# Patient Record
Sex: Female | Born: 1995 | Race: Black or African American | Hispanic: No | Marital: Single | Smoking: Former smoker
Health system: Southern US, Community
[De-identification: ages and names within clinical notes are randomized; demographics above are authoritative.]

## PROBLEM LIST (undated history)

## (undated) DIAGNOSIS — A749 Chlamydial infection, unspecified: Secondary | ICD-10-CM

## (undated) DIAGNOSIS — A549 Gonococcal infection, unspecified: Secondary | ICD-10-CM

## (undated) DIAGNOSIS — Z789 Other specified health status: Secondary | ICD-10-CM

## (undated) HISTORY — DX: Chlamydial infection, unspecified: A74.9

## (undated) HISTORY — PX: NO PAST SURGERIES: SHX2092

## (undated) HISTORY — DX: Gonococcal infection, unspecified: A54.9

---
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
## 2016-01-19 ENCOUNTER — Ambulatory Visit (INDEPENDENT_AMBULATORY_CARE_PROVIDER_SITE_OTHER): Payer: Managed Care, Other (non HMO) | Admitting: Family Medicine

## 2016-01-19 VITALS — BP 108/84 | HR 80 | Temp 98.2°F | Resp 16 | Ht 65.0 in | Wt 190.0 lb

## 2016-01-19 DIAGNOSIS — B349 Viral infection, unspecified: Secondary | ICD-10-CM

## 2016-01-19 DIAGNOSIS — J988 Other specified respiratory disorders: Secondary | ICD-10-CM

## 2016-01-19 DIAGNOSIS — R509 Fever, unspecified: Secondary | ICD-10-CM | POA: Diagnosis not present

## 2016-01-19 DIAGNOSIS — B9789 Other viral agents as the cause of diseases classified elsewhere: Secondary | ICD-10-CM

## 2016-01-19 LAB — POCT RAPID STREP A (OFFICE): Rapid Strep A Screen: NEGATIVE

## 2016-01-19 LAB — POCT INFLUENZA A/B
INFLUENZA A, POC: NEGATIVE
INFLUENZA B, POC: NEGATIVE

## 2016-01-19 NOTE — Patient Instructions (Addendum)
Great to meet you!  Use tylenol and motrin as needed, Mucinex DM 12 hour is a very good cough medicine    Viral Infections A viral infection can be caused by different types of viruses.Most viral infections are not serious and resolve on their own. However, some infections may cause severe symptoms and may lead to further complications. SYMPTOMS Viruses can frequently cause:  Minor sore throat.  Aches and pains.  Headaches.  Runny nose.  Different types of rashes.  Watery eyes.  Tiredness.  Cough.  Loss of appetite.  Gastrointestinal infections, resulting in nausea, vomiting, and diarrhea. These symptoms do not respond to antibiotics because the infection is not caused by bacteria. However, you might catch a bacterial infection following the viral infection. This is sometimes called a "superinfection." Symptoms of such a bacterial infection may include:  Worsening sore throat with pus and difficulty swallowing.  Swollen neck glands.  Chills and a high or persistent fever.  Severe headache.  Tenderness over the sinuses.  Persistent overall ill feeling (malaise), muscle aches, and tiredness (fatigue).  Persistent cough.  Yellow, green, or brown mucus production with coughing. HOME CARE INSTRUCTIONS   Only take over-the-counter or prescription medicines for pain, discomfort, diarrhea, or fever as directed by your caregiver.  Drink enough water and fluids to keep your urine clear or pale yellow. Sports drinks can provide valuable electrolytes, sugars, and hydration.  Get plenty of rest and maintain proper nutrition. Soups and broths with crackers or rice are fine. SEEK IMMEDIATE MEDICAL CARE IF:   You have severe headaches, shortness of breath, chest pain, neck pain, or an unusual rash.  You have uncontrolled vomiting, diarrhea, or you are unable to keep down fluids.  You or your child has an oral temperature above 102 F (38.9 C), not controlled by  medicine.  Your baby is older than 3 months with a rectal temperature of 102 F (38.9 C) or higher.  Your baby is 773 months old or younger with a rectal temperature of 100.4 F (38 C) or higher. MAKE SURE YOU:   Understand these instructions.  Will watch your condition.  Will get help right away if you are not doing well or get worse.   This information is not intended to replace advice given to you by your health care provider. Make sure you discuss any questions you have with your health care provider.   Document Released: 07/26/2005 Document Revised: 01/08/2012 Document Reviewed: 03/24/2015 Elsevier Interactive Patient Education [employer]2016 Elsevier Inc.

## 2016-01-19 NOTE — Progress Notes (Signed)
   HPI  Patient presents today with fever and sore throat.  Patient explains over the last 2 days she's had sore throat, one day ago she developed fever of 101.4. She's also got body aches, nasal congestion, and mild dry cough. She denies any shortness of breath, chest pain, or food or fluid intolerance.  She has no sick contacts that she knows of.   PMH: Smoking status noted ROS: Per HPI  Objective: BP 108/84 mmHg  Pulse 80  Temp(Src) 98.2 F (36.8 C) (Oral)  Resp 16  Ht 5\' 5"  (1.651 m)  Wt 190 lb (86.183 kg)  BMI 31.62 kg/m2  LMP 12/29/2015 Gen: NAD, alert, cooperative with exam HEENT: NCAT, nares with swollen turbinates bilaterally, TMs normal bilaterally, oropharynx with slightly swollen tonsils that are not erythematous Neck: No tender lymphadenopathy CV: RRR, good S1/S2, no murmur Resp: CTABL, no wheezes, non-labored Ext: No edema, warm Neuro: Alert and oriented, No gross deficits  Rapid flu and strep neg  Assessment and plan:  # Viral resp illness Discussed supportive care, usual course of illness RTC with any concerns or worsening symps   Murtis SinkSam Bradshaw, MD [facility] 01/19/2016, 10:23 AM

## 2016-05-01 ENCOUNTER — Telehealth: Payer: Self-pay | Admitting: Nurse Practitioner

## 2016-05-01 ENCOUNTER — Ambulatory Visit (INDEPENDENT_AMBULATORY_CARE_PROVIDER_SITE_OTHER): Payer: Managed Care, Other (non HMO) | Admitting: Nurse Practitioner

## 2016-05-01 ENCOUNTER — Encounter: Payer: Self-pay | Admitting: Nurse Practitioner

## 2016-05-01 VITALS — BP 100/60 | HR 74 | Temp 98.3°F | Resp 18 | Ht 65.0 in | Wt 195.0 lb

## 2016-05-01 DIAGNOSIS — N912 Amenorrhea, unspecified: Secondary | ICD-10-CM

## 2016-05-01 LAB — POCT URINE PREGNANCY: Preg Test, Ur: POSITIVE — AB

## 2016-05-01 NOTE — Progress Notes (Signed)
20 y.o. G1P0 Single  African American [facility] here for consultation and confirmation of pregnancy with LMP 02/28/2016.  She is now at 9 wk's and 1 day.  She denies any vaginal bleeding or pain.  She is not on prenatal MVI but plans to start.  Same partner for 9 month.  Had been on Mirena IUD for about a year.  She had this removed 12/2015 after frequent vaginitis symptoms.  [facility].  Since then condoms only - but non compliant.  She has a list of OB/ GYN and will make her own apt.  Mother is with her today. 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  Patient's last menstrual period was 02/28/2016.          Sexually active: Yes.    The current method of family planning is none.    Exercising: No.  The patient does not participate in regular exercise at present. Smoker:  no  Health Maintenance: Pap:  never TDaP:  05/11/2015  Gardasil: Unsure HIV: 09/16/15 Neg Labs: STD checked 09/16/2015   reports that she has never smoked. She has never used smokeless tobacco. She reports that she does not drink alcohol or use illicit drugs.  Past Medical History  Diagnosis Date  . Allergy   . Gonorrhea 08/2015  . Chlamydia infection 08/2015    History reviewed. No pertinent past surgical history.  No current outpatient prescriptions on file.   No current facility-administered medications for this visit.    Family History  Problem Relation Age of Onset  . Allergies Neg Hx   . Cancer Neg Hx   . Hyperlipidemia Neg Hx   . Heart disease Neg Hx   . Mental illness Neg Hx   . Mental retardation Neg Hx   . Obesity Neg Hx   . Seizures Neg Hx   . Epilepsy Neg Hx   . Diabetes Maternal Grandmother   . Diabetes Maternal Grandfather     ROS:  Pertinent items are noted in HPI.  Otherwise, a comprehensive ROS was negative.  Exam:   BP 100/60 mmHg  Pulse 74  Temp(Src) 98.3 F (36.8 C) (Oral)  Resp 18  Ht 5\' 5"  (1.651 m)  Wt 195 lb (88.451 kg)  BMI 32.45 kg/m2   LMP 02/28/2016 Height: 5\' 5"  (165.1 cm) Ht Readings from Last 3 Encounters:  05/01/16 5\' 5"  (1.651 m) (61 %*, Z = 0.28)  01/19/16 5\' 5"  (1.651 m) (61 %*, Z = 0.29)  09/16/15 5\' 5"  (1.651 m) (61 %*, Z = 0.29)   * Growth percentiles are based on CDC 2-20 Years data.    General appearance: alert, cooperative and appears stated age  Pelvic: not done at this time - will be done with OB/GYN  Chaperone present: mother with pt during interview  A:  Amenorrhea with + UPT  Prior use of Mirena IUD - removed 12/2015 after 1 yr secondary to infections  History of GC and Chl 08/2015  P:   Reviewed health and wellness pertinent to exam  She will make her own apt.  If she needs help to call back  She is to report any vaginal bleeding and / or pain  She will start on Prenatal MVI  Counseled on adequate intake of calcium and vitamin D, diet and exercise return annually or prn  An After Visit Summary was printed and given to the patient.

## 2016-05-01 NOTE — Telephone Encounter (Signed)
Patient was seen in office for pregnancy confirmation and will [facility] Attention Ms. Hansen and fax number is [phone].

## 2016-05-01 NOTE — Telephone Encounter (Signed)
Routing to Ria CommentPatricia Grubb, FNP and encounter is closed.

## 2016-05-01 NOTE — Patient Instructions (Addendum)
EDD 12/04/16  First Trimester of Pregnancy The first trimester of pregnancy is from week 1 until the end of week 12 (months 1 through 3). A week after a sperm fertilizes an egg, the egg will implant on the wall of the uterus. This embryo will begin to develop into a baby. Genes from you and your partner are forming the baby. The female genes determine whether the baby is a boy or a girl. At 6-8 weeks, the eyes and face are formed, and the heartbeat can be seen on ultrasound. At the end of 12 weeks, all the baby's organs are formed.  Now that you are pregnant, you will want to do everything you can to have a healthy baby. Two of the most important things are to get good prenatal care and to follow your health care provider's instructions. Prenatal care is all the medical care you receive before the baby's birth. This care will help prevent, find, and treat any problems during the pregnancy and childbirth. BODY CHANGES Your body goes through many changes during pregnancy. The changes vary from woman to woman.   You may gain or lose a couple of pounds at first.  You may feel sick to your stomach (nauseous) and throw up (vomit). If the vomiting is uncontrollable, call your health care provider.  You may tire easily.  You may develop headaches that can be relieved by medicines approved by your health care provider.  You may urinate more often. Painful urination may mean you have a bladder infection.  You may develop heartburn as a result of your pregnancy.  You may develop constipation because certain hormones are causing the muscles that push waste through your intestines to slow down.  You may develop hemorrhoids or swollen, bulging veins (varicose veins).  Your breasts may begin to grow larger and become tender. Your nipples may stick out more, and the tissue that surrounds them (areola) may become darker.  Your gums may bleed and may be sensitive to brushing and flossing.  Dark spots or  blotches (chloasma, mask of pregnancy) may develop on your face. This will likely fade after the baby is born.  Your menstrual periods will stop.  You may have a loss of appetite.  You may develop cravings for certain kinds of food.  You may have changes in your emotions from day to day, such as being excited to be pregnant or being concerned that something may go wrong with the pregnancy and baby.  You may have more vivid and strange dreams.  You may have changes in your hair. These can include thickening of your hair, rapid growth, and changes in texture. Some women also have hair loss during or after pregnancy, or hair that feels dry or thin. Your hair will most likely return to normal after your baby is born. WHAT TO EXPECT AT YOUR PRENATAL VISITS During a routine prenatal visit:  You will be weighed to make sure you and the baby are growing normally.  Your blood pressure will be taken.  Your abdomen will be measured to track your baby's growth.  The fetal heartbeat will be listened to starting around week 10 or 12 of your pregnancy.  Test results from any previous visits will be discussed. Your health care provider may ask you:  How you are feeling.  If you are feeling the baby move.  If you have had any abnormal symptoms, such as leaking fluid, bleeding, severe headaches, or abdominal cramping.  If you are using  any tobacco products, including cigarettes, chewing tobacco, and electronic cigarettes.  If you have any questions. Other tests that may be performed during your first trimester include:  Blood tests to find your blood type and to check for the presence of any previous infections. They will also be used to check for low iron levels (anemia) and Rh antibodies. Later in the pregnancy, blood tests for diabetes will be done along with other tests if problems develop.  Urine tests to check for infections, diabetes, or protein in the urine.  An ultrasound to confirm  the proper growth and development of the baby.  An amniocentesis to check for possible genetic problems.  Fetal screens for spina bifida and Down syndrome.  You may need other tests to make sure you and the baby are doing well.  HIV (human immunodeficiency virus) testing. Routine prenatal testing includes screening for HIV, unless you choose not to have this test. HOME CARE INSTRUCTIONS  Medicines  Follow your health care provider's instructions regarding medicine use. Specific medicines may be either safe or unsafe to take during pregnancy.  Take your prenatal vitamins as directed.  If you develop constipation, try taking a stool softener if your health care provider approves. Diet  Eat regular, well-balanced meals. Choose a variety of foods, such as meat or vegetable-based protein, fish, milk and low-fat dairy products, vegetables, fruits, and whole grain breads and cereals. Your health care provider will help you determine the amount of weight gain that is right for you.  Avoid raw meat and uncooked cheese. These carry germs that can cause birth defects in the baby.  Eating four or five small meals rather than three large meals a day may help relieve nausea and vomiting. If you start to feel nauseous, eating a few soda crackers can be helpful. Drinking liquids between meals instead of during meals also seems to help nausea and vomiting.  If you develop constipation, eat more high-fiber foods, such as fresh vegetables or fruit and whole grains. Drink enough fluids to keep your urine clear or pale yellow. Activity and Exercise  Exercise only as directed by your health care provider. Exercising will help you:  Control your weight.  Stay in shape.  Be prepared for labor and delivery.  Experiencing pain or cramping in the lower abdomen or low back is a good sign that you should stop exercising. Check with your health care provider before continuing normal exercises.  Try to avoid  standing for long periods of time. Move your legs often if you must stand in one place for a long time.  Avoid heavy lifting.  Wear low-heeled shoes, and practice good posture.  You may continue to have sex unless your health care provider directs you otherwise. Relief of Pain or Discomfort  Wear a good support bra for breast tenderness.   Take warm sitz baths to soothe any pain or discomfort caused by hemorrhoids. Use hemorrhoid cream if your health care provider approves.   Rest with your legs elevated if you have leg cramps or low back pain.  If you develop varicose veins in your legs, wear support hose. Elevate your feet for 15 minutes, 3-4 times a day. Limit salt in your diet. Prenatal Care  Schedule your prenatal visits by the twelfth week of pregnancy. They are usually scheduled monthly at first, then more often in the last 2 months before delivery.  Write down your questions. Take them to your prenatal visits.  Keep all your prenatal visits as  directed by your health care provider. Safety  Wear your seat belt at all times when driving.  Make a list of emergency phone numbers, including numbers for family, friends, the hospital, and police and fire departments. General Tips  Ask your health care provider for a referral to a local prenatal education class. Begin classes no later than at the beginning of month 6 of your pregnancy.  Ask for help if you have counseling or nutritional needs during pregnancy. Your health care provider can offer advice or refer you to specialists for help with various needs.  Do not use hot tubs, steam rooms, or saunas.  Do not douche or use tampons or scented sanitary pads.  Do not cross your legs for long periods of time.  Avoid cat litter boxes and soil used by cats. These carry germs that can cause birth defects in the baby and possibly loss of the fetus by miscarriage or stillbirth.  Avoid all smoking, herbs, alcohol, and medicines  not prescribed by your health care provider. Chemicals in these affect the formation and growth of the baby.  Do not use any tobacco products, including cigarettes, chewing tobacco, and electronic cigarettes. If you need help quitting, ask your health care provider. You may receive counseling support and other resources to help you quit.  Schedule a dentist appointment. At home, brush your teeth with a soft toothbrush and be gentle when you floss. SEEK MEDICAL CARE IF:   You have dizziness.  You have mild pelvic cramps, pelvic pressure, or nagging pain in the abdominal area.  You have persistent nausea, vomiting, or diarrhea.  You have a bad smelling vaginal discharge.  You have pain with urination.  You notice increased swelling in your face, hands, legs, or ankles. SEEK IMMEDIATE MEDICAL CARE IF:   You have a fever.  You are leaking fluid from your vagina.  You have spotting or bleeding from your vagina.  You have severe abdominal cramping or pain.  You have rapid weight gain or loss.  You vomit blood or material that looks like coffee grounds.  You are exposed to [place] measles and have never had them.  You are exposed to fifth disease or chickenpox.  You develop a severe headache.  You have shortness of breath.  You have any kind of trauma, such as from a fall or a car accident.   This information is not intended to replace advice given to you by your health care provider. Make sure you discuss any questions you have with your health care provider.   Document Released: 10/10/2001 Document Revised: 11/06/2014 Document Reviewed: 08/26/2013 Elsevier Interactive Patient Education [employer].

## 2016-05-01 NOTE — Telephone Encounter (Signed)
Record of pregnancy confirmation faxed to [employer] per request and patient is aware.

## 2016-05-02 NOTE — Progress Notes (Signed)
Encounter reviewed by Dr. Brook Amundson C. Silva.  

## 2016-05-10 ENCOUNTER — Telehealth: Payer: Self-pay | Admitting: *Deleted

## 2016-05-10 NOTE — Telephone Encounter (Addendum)
Patient requesting approximate due date for pregnancy on paper.

## 2016-05-10 NOTE — Telephone Encounter (Signed)
I was able to edit AVS and gave a copy to patient.  Mrs Cristina [name] FYI Encounter closed

## 2016-05-29 ENCOUNTER — Ambulatory Visit: Payer: Managed Care, Other (non HMO)

## 2016-05-30 ENCOUNTER — Ambulatory Visit (INDEPENDENT_AMBULATORY_CARE_PROVIDER_SITE_OTHER): Payer: Managed Care, Other (non HMO) | Admitting: Family Medicine

## 2016-05-30 VITALS — BP 118/78 | HR 74 | Temp 98.9°F | Resp 17 | Ht 65.0 in | Wt 192.0 lb

## 2016-05-30 DIAGNOSIS — Z113 Encounter for screening for infections with a predominantly sexual mode of transmission: Secondary | ICD-10-CM | POA: Diagnosis not present

## 2016-05-30 DIAGNOSIS — Z3401 Encounter for supervision of normal first pregnancy, first trimester: Secondary | ICD-10-CM

## 2016-05-30 DIAGNOSIS — N898 Other specified noninflammatory disorders of vagina: Secondary | ICD-10-CM

## 2016-05-30 LAB — HIV ANTIBODY (ROUTINE TESTING W REFLEX): HIV: NONREACTIVE

## 2016-05-30 LAB — POCT WET + KOH PREP
Trich by wet prep: ABSENT
Yeast by KOH: ABSENT
Yeast by wet prep: ABSENT

## 2016-05-30 LAB — POC MICROSCOPIC URINALYSIS (UMFC): MUCUS RE: ABSENT

## 2016-05-30 LAB — OB RESULTS CONSOLE GC/CHLAMYDIA
Chlamydia: NEGATIVE
Gonorrhea: NEGATIVE

## 2016-05-30 LAB — HEPATITIS C ANTIBODY: HCV AB: NEGATIVE

## 2016-05-30 NOTE — Patient Instructions (Addendum)
IF you received an x-ray today, you will receive an invoice from [facility]. Please contact [facility] at [phone] with questions or concerns regarding your invoice.   IF you received labwork today, you will receive an invoice from [employer]. Please contact [facility] at [phone] with questions or concerns regarding your invoice.   Our billing staff will not be able to assist you with questions regarding bills from these companies.  You will be contacted with the lab results as soon as they are available. The fastest way to get your results is to activate your My Chart account. Instructions are located on the last page of this paperwork. If you have not heard from [place] regarding the results in 2 weeks, please contact this office.     First Trimester of Pregnancy The first trimester of pregnancy is from week 1 until the end of week 12 (months 1 through 3). A week after a sperm fertilizes an egg, the egg will implant on the wall of the uterus. This embryo will begin to develop into a baby. Genes from you and your partner are forming the baby. The female genes determine whether the baby is a boy or a girl. At 6-8 weeks, the eyes and face are formed, and the heartbeat can be seen on ultrasound. At the end of 12 weeks, all the baby's organs are formed.  Now that you are pregnant, you will want to do everything you can to have a healthy baby. Two of the most important things are to get good prenatal care and to follow your health care provider's instructions. Prenatal care is all the medical care you receive before the baby's birth. This care will help prevent, find, and treat any problems during the pregnancy and childbirth. BODY CHANGES Your body goes through many changes during pregnancy. The changes vary from woman to woman.   You may gain or lose a couple of pounds at first.  You may feel sick to your stomach (nauseous) and throw up (vomit).  If the vomiting is uncontrollable, call your health care provider.  You may tire easily.  You may develop headaches that can be relieved by medicines approved by your health care provider.  You may urinate more often. Painful urination may mean you have a bladder infection.  You may develop heartburn as a result of your pregnancy.  You may develop constipation because certain hormones are causing the muscles that push waste through your intestines to slow down.  You may develop hemorrhoids or swollen, bulging veins (varicose veins).  Your breasts may begin to grow larger and become tender. Your nipples may stick out more, and the tissue that surrounds them (areola) may become darker.  Your gums may bleed and may be sensitive to brushing and flossing.  Dark spots or blotches (chloasma, mask of pregnancy) may develop on your face. This will likely fade after the baby is born.  Your menstrual periods will stop.  You may have a loss of appetite.  You may develop cravings for certain kinds of food.  You may have changes in your emotions from day to day, such as being excited to be pregnant or being concerned that something may go wrong with the pregnancy and baby.  You may have more vivid and strange dreams.  You may have changes in your hair. These can include thickening of your hair, rapid growth, and changes in texture. Some women also have hair loss during or after pregnancy,  or hair that feels dry or thin. Your hair will most likely return to normal after your baby is born. WHAT TO EXPECT AT YOUR PRENATAL VISITS During a routine prenatal visit:  You will be weighed to make sure you and the baby are growing normally.  Your blood pressure will be taken.  Your abdomen will be measured to track your baby's growth.  The fetal heartbeat will be listened to starting around week 10 or 12 of your pregnancy.  Test results from any previous visits will be discussed. Your health care  provider may ask you:  How you are feeling.  If you are feeling the baby move.  If you have had any abnormal symptoms, such as leaking fluid, bleeding, severe headaches, or abdominal cramping.  If you are using any tobacco products, including cigarettes, chewing tobacco, and electronic cigarettes.  If you have any questions. Other tests that may be performed during your first trimester include:  Blood tests to find your blood type and to check for the presence of any previous infections. They will also be used to check for low iron levels (anemia) and Rh antibodies. Later in the pregnancy, blood tests for diabetes will be done along with other tests if problems develop.  Urine tests to check for infections, diabetes, or protein in the urine.  An ultrasound to confirm the proper growth and development of the baby.  An amniocentesis to check for possible genetic problems.  Fetal screens for spina bifida and Down syndrome.  You may need other tests to make sure you and the baby are doing well.  HIV (human immunodeficiency virus) testing. Routine prenatal testing includes screening for HIV, unless you choose not to have this test. HOME CARE INSTRUCTIONS  Medicines  Follow your health care provider's instructions regarding medicine use. Specific medicines may be either safe or unsafe to take during pregnancy.  Take your prenatal vitamins as directed.  If you develop constipation, try taking a stool softener if your health care provider approves. Diet  Eat regular, well-balanced meals. Choose a variety of foods, such as meat or vegetable-based protein, fish, milk and low-fat dairy products, vegetables, fruits, and whole grain breads and cereals. Your health care provider will help you determine the amount of weight gain that is right for you.  Avoid raw meat and uncooked cheese. These carry germs that can cause birth defects in the baby.  Eating four or five small meals rather than  three large meals a day may help relieve nausea and vomiting. If you start to feel nauseous, eating a few soda crackers can be helpful. Drinking liquids between meals instead of during meals also seems to help nausea and vomiting.  If you develop constipation, eat more high-fiber foods, such as fresh vegetables or fruit and whole grains. Drink enough fluids to keep your urine clear or pale yellow. Activity and Exercise  Exercise only as directed by your health care provider. Exercising will help you:  Control your weight.  Stay in shape.  Be prepared for labor and delivery.  Experiencing pain or cramping in the lower abdomen or low back is a good sign that you should stop exercising. Check with your health care provider before continuing normal exercises.  Try to avoid standing for long periods of time. Move your legs often if you must stand in one place for a long time.  Avoid heavy lifting.  Wear low-heeled shoes, and practice good posture.  You may continue to have sex unless your  health care provider directs you otherwise. Relief of Pain or Discomfort  Wear a good support bra for breast tenderness.   Take warm sitz baths to soothe any pain or discomfort caused by hemorrhoids. Use hemorrhoid cream if your health care provider approves.   Rest with your legs elevated if you have leg cramps or low back pain.  If you develop varicose veins in your legs, wear support hose. Elevate your feet for 15 minutes, 3-4 times a day. Limit salt in your diet. Prenatal Care  Schedule your prenatal visits by the twelfth week of pregnancy. They are usually scheduled monthly at first, then more often in the last 2 months before delivery.  Write down your questions. Take them to your prenatal visits.  Keep all your prenatal visits as directed by your health care provider. Safety  Wear your seat belt at all times when driving.  Make a list of emergency phone numbers, including numbers for  family, friends, the hospital, and police and fire departments. General Tips  Ask your health care provider for a referral to a local prenatal education class. Begin classes no later than at the beginning of month 6 of your pregnancy.  Ask for help if you have counseling or nutritional needs during pregnancy. Your health care provider can offer advice or refer you to specialists for help with various needs.  Do not use hot tubs, steam rooms, or saunas.  Do not douche or use tampons or scented sanitary pads.  Do not cross your legs for long periods of time.  Avoid cat litter boxes and soil used by cats. These carry germs that can cause birth defects in the baby and possibly loss of the fetus by miscarriage or stillbirth.  Avoid all smoking, herbs, alcohol, and medicines not prescribed by your health care provider. Chemicals in these affect the formation and growth of the baby.  Do not use any tobacco products, including cigarettes, chewing tobacco, and electronic cigarettes. If you need help quitting, ask your health care provider. You may receive counseling support and other resources to help you quit.  Schedule a dentist appointment. At home, brush your teeth with a soft toothbrush and be gentle when you floss. SEEK MEDICAL CARE IF:   You have dizziness.  You have mild pelvic cramps, pelvic pressure, or nagging pain in the abdominal area.  You have persistent nausea, vomiting, or diarrhea.  You have a bad smelling vaginal discharge.  You have pain with urination.  You notice increased swelling in your face, hands, legs, or ankles. SEEK IMMEDIATE MEDICAL CARE IF:   You have a fever.  You are leaking fluid from your vagina.  You have spotting or bleeding from your vagina.  You have severe abdominal cramping or pain.  You have rapid weight gain or loss.  You vomit blood or material that looks like coffee grounds.  You are exposed to [place] measles and have never had  them.  You are exposed to fifth disease or chickenpox.  You develop a severe headache.  You have shortness of breath.  You have any kind of trauma, such as from a fall or a car accident.   This information is not intended to replace advice given to you by your health care provider. Make sure you discuss any questions you have with your health care provider.   Document Released: 10/10/2001 Document Revised: 11/06/2014 Document Reviewed: 08/26/2013 Elsevier Interactive Patient Education 2016 [employer].  Second Trimester of Pregnancy The second trimester is  from week 13 through week 28, months 4 through 6. The second trimester is often a time when you feel your best. Your body has also adjusted to being pregnant, and you begin to feel better physically. Usually, morning sickness has lessened or quit completely, you may have more energy, and you may have an increase in appetite. The second trimester is also a time when the fetus is growing rapidly. At the end of the sixth month, the fetus is about 9 inches long and weighs about 1 pounds. You will likely begin to feel the baby move (quickening) between 18 and 20 weeks of the pregnancy. BODY CHANGES Your body goes through many changes during pregnancy. The changes vary from woman to woman.   Your weight will continue to increase. You will notice your lower abdomen bulging out.  You may begin to get stretch marks on your hips, abdomen, and breasts.  You may develop headaches that can be relieved by medicines approved by your health care provider.  You may urinate more often because the fetus is pressing on your bladder.  You may develop or continue to have heartburn as a result of your pregnancy.  You may develop constipation because certain hormones are causing the muscles that push waste through your intestines to slow down.  You may develop hemorrhoids or swollen, bulging veins (varicose veins).  You may have back pain because of  the weight gain and pregnancy hormones relaxing your joints between the bones in your pelvis and as a result of a shift in weight and the muscles that support your balance.  Your breasts will continue to grow and be tender.  Your gums may bleed and may be sensitive to brushing and flossing.  Dark spots or blotches (chloasma, mask of pregnancy) may develop on your face. This will likely fade after the baby is born.  A dark line from your belly button to the pubic area (linea nigra) may appear. This will likely fade after the baby is born.  You may have changes in your hair. These can include thickening of your hair, rapid growth, and changes in texture. Some women also have hair loss during or after pregnancy, or hair that feels dry or thin. Your hair will most likely return to normal after your baby is born. WHAT TO EXPECT AT YOUR PRENATAL VISITS During a routine prenatal visit:  You will be weighed to make sure you and the fetus are growing normally.  Your blood pressure will be taken.  Your abdomen will be measured to track your baby's growth.  The fetal heartbeat will be listened to.  Any test results from the previous visit will be discussed. Your health care provider may ask you:  How you are feeling.  If you are feeling the baby move.  If you have had any abnormal symptoms, such as leaking fluid, bleeding, severe headaches, or abdominal cramping.  If you are using any tobacco products, including cigarettes, chewing tobacco, and electronic cigarettes.  If you have any questions. Other tests that may be performed during your second trimester include:  Blood tests that check for:  Low iron levels (anemia).  Gestational diabetes (between 24 and 28 weeks).  Rh antibodies.  Urine tests to check for infections, diabetes, or protein in the urine.  An ultrasound to confirm the proper growth and development of the baby.  An amniocentesis to check for possible genetic  problems.  Fetal screens for spina bifida and Down syndrome.  HIV (human immunodeficiency  virus) testing. Routine prenatal testing includes screening for HIV, unless you choose not to have this test. HOME CARE INSTRUCTIONS   Avoid all smoking, herbs, alcohol, and unprescribed drugs. These chemicals affect the formation and growth of the baby.  Do not use any tobacco products, including cigarettes, chewing tobacco, and electronic cigarettes. If you need help quitting, ask your health care provider. You may receive counseling support and other resources to help you quit.  Follow your health care provider's instructions regarding medicine use. There are medicines that are either safe or unsafe to take during pregnancy.  Exercise only as directed by your health care provider. Experiencing uterine cramps is a good sign to stop exercising.  Continue to eat regular, healthy meals.  Wear a good support bra for breast tenderness.  Do not use hot tubs, steam rooms, or saunas.  Wear your seat belt at all times when driving.  Avoid raw meat, uncooked cheese, cat litter boxes, and soil used by cats. These carry germs that can cause birth defects in the baby.  Take your prenatal vitamins.  Take 1500-2000 mg of calcium daily starting at the 20th week of pregnancy until you deliver your baby.  Try taking a stool softener (if your health care provider approves) if you develop constipation. Eat more high-fiber foods, such as fresh vegetables or fruit and whole grains. Drink plenty of fluids to keep your urine clear or pale yellow.  Take warm sitz baths to soothe any pain or discomfort caused by hemorrhoids. Use hemorrhoid cream if your health care provider approves.  If you develop varicose veins, wear support hose. Elevate your feet for 15 minutes, 3-4 times a day. Limit salt in your diet.  Avoid heavy lifting, wear low heel shoes, and practice good posture.  Rest with your legs elevated if you  have leg cramps or low back pain.  Visit your dentist if you have not gone yet during your pregnancy. Use a soft toothbrush to brush your teeth and be gentle when you floss.  A sexual relationship may be continued unless your health care provider directs you otherwise.  Continue to go to all your prenatal visits as directed by your health care provider. SEEK MEDICAL CARE IF:   You have dizziness.  You have mild pelvic cramps, pelvic pressure, or nagging pain in the abdominal area.  You have persistent nausea, vomiting, or diarrhea.  You have a bad smelling vaginal discharge.  You have pain with urination. SEEK IMMEDIATE MEDICAL CARE IF:   You have a fever.  You are leaking fluid from your vagina.  You have spotting or bleeding from your vagina.  You have severe abdominal cramping or pain.  You have rapid weight gain or loss.  You have shortness of breath with chest pain.  You notice sudden or extreme swelling of your face, hands, ankles, feet, or legs.  You have not felt your baby move in over an hour.  You have severe headaches that do not go away with medicine.  You have vision changes.   This information is not intended to replace advice given to you by your health care provider. Make sure you discuss any questions you have with your health care provider.   Document Released: 10/10/2001 Document Revised: 11/06/2014 Document Reviewed: 12/17/2012 Elsevier Interactive Patient Education [employer]

## 2016-05-31 LAB — PRENATAL PROFILE (SOLSTAS)
ANTIBODY SCREEN: NEGATIVE
BASOS PCT: 0 %
Basophils Absolute: 0 cells/uL (ref 0–200)
EOS ABS: 119 {cells}/uL (ref 15–500)
Eosinophils Relative: 1 %
HCT: 37.2 % (ref 35.0–45.0)
HEMOGLOBIN: 12.5 g/dL (ref 11.7–15.5)
HIV 1&2 Ab, 4th Generation: NONREACTIVE
Hepatitis B Surface Ag: NEGATIVE
LYMPHS ABS: 1666 {cells}/uL (ref 850–3900)
Lymphocytes Relative: 14 %
MCH: 28.6 pg (ref 27.0–33.0)
MCHC: 33.6 g/dL (ref 32.0–36.0)
MCV: 85.1 fL (ref 80.0–100.0)
MONOS PCT: 6 %
MPV: 10.6 fL (ref 7.5–12.5)
Monocytes Absolute: 714 cells/uL (ref 200–950)
NEUTROS ABS: 9401 {cells}/uL — AB (ref 1500–7800)
Neutrophils Relative %: 79 %
PLATELETS: 296 10*3/uL (ref 140–400)
RBC: 4.37 MIL/uL (ref 3.80–5.10)
RDW: 13.7 % (ref 11.0–15.0)
Rh Type: POSITIVE
Rubella: 7.99 Index — ABNORMAL HIGH (ref <0.90)
WBC: 11.9 10*3/uL — ABNORMAL HIGH (ref 3.8–10.8)

## 2016-05-31 LAB — RPR

## 2016-05-31 LAB — GC/CHLAMYDIA PROBE AMP
CT PROBE, AMP APTIMA: NOT DETECTED
GC PROBE AMP APTIMA: NOT DETECTED

## 2016-06-05 NOTE — Progress Notes (Addendum)
By signing my name below, I, Mesha Guinyard, attest that this documentation has been prepared under the direction and in the presence of Delman Cheadle, MD.  Electronically Signed: Verlee Monte, Medical Scribe. 06/05/16. 9:50 AM.  Subjective:    Patient ID: Cristina [name], female    DOB: Nov 12, 1995, 20 y.o.   MRN: [id number]  HPI Chief Complaint  Patient presents with  . Other    STD screening    HPI Comments: Cristina [name] is a 20 y.o. female who presents to the [facility] complaining of STD exposure. She has a hx of gonorrhea and chlamydia. She had her IUD removed 6 months ago. GYN is Ms Raquel Sarna, and Dr. Regis Bill is her PCP. Pt was seen by her GYN 1 month prior and found to be pregnant with LMP of 02/28/16. Pt's in in end of week 12 of her pregnancy and is due 12/04/2016.  Pt mentions she doesn't have an OB/GYN yet, she was going [facility] for her care. Pt reports morning nausea that's getting better, and she's taking her prenatal vitamins regularly. Pt had a yeast infection lest week, but has resolved since then. Pt denies dysuria, urinary frequency, hematuria, urinary urgency, and vaginal discharge.   There are no active problems to display for this patient.  Past Medical History:  Diagnosis Date  . Allergy   . Chlamydia infection 08/2015   IUD - Mirena removed 12/2015  . Gonorrhea 08/2015   IUD Mirena removed 12/2015   No past surgical history on file. No Known Allergies Prior to Admission medications   Not on File   Social History   Social History  . Marital status: Single    Spouse name: n/a  . Number of children: 0  . Years of education: N/A   Occupational History  . student     [employer]   Social History Main Topics  . Smoking status: Never Smoker  . Smokeless tobacco: Never Used  . Alcohol use No  . Drug use: No  . Sexual activity: Yes    Birth control/ protection: None   Other Topics Concern  . Not on file   Social History  Narrative   Lives with mom.  Brother is away at college.  Sees father weekly.   Depression screen [facility] 2/9 05/30/2016 01/19/2016 09/16/2015  Decreased Interest 0 0 0  Down, Depressed, Hopeless 0 0 -  PHQ - 2 Score 0 0 0   Review of Systems  Constitutional: Negative for activity change, chills, fever and unexpected weight change.  Gastrointestinal: Positive for nausea. Negative for abdominal distention, abdominal pain and vomiting.  Genitourinary: Negative for difficulty urinating, dysuria, frequency, hematuria, menstrual problem, urgency, vaginal bleeding and vaginal pain.    Objective:  Physical Exam  Constitutional: She appears well-developed and well-nourished. No distress.  HENT:  Head: Normocephalic and atraumatic.  Eyes: Conjunctivae are normal.  Neck: Neck supple.  Cardiovascular: Normal rate, regular rhythm, S1 normal, S2 normal and normal heart sounds.  Exam reveals no gallop and no friction rub.   No murmur heard. Pulmonary/Chest: Effort normal.  Genitourinary: Vagina normal. Uterus is enlarged. Uterus is not tender.  Genitourinary Comments: Nl vagina Mild amount of thin white vaginal discharge Uterus felt enlarged, and heavy- approximately navel orange to softball, but was retroverted  Neurological: She is alert.  Skin: Skin is warm and dry.  Psychiatric: She has a normal mood and affect. Her behavior is normal.  Nursing note and vitals  reviewed.  BP 118/78 (BP Location: Left Arm, Patient Position: Sitting, Cuff Size: Normal)   Pulse 74   Temp 98.9 F (37.2 C) (Oral)   Resp 17   Ht _0  (1.651 m)   Wt 192 lb (87.1 kg)   LMP 02/28/2016   SpO2 100%   BMI 31.95 kg/m    Results for orders placed or performed in visit on 05/30/16  GC/Chlamydia Probe Amp  Result Value Ref Range   CT Probe RNA NOT DETECTED    GC Probe RNA NOT DETECTED   HIV antibody  Result Value Ref Range   HIV 1&2 Ab, 4th Generation NONREACTIVE NONREACTIVE  RPR  Result Value Ref Range   RPR Ser  Ql NON REAC NON REAC  Hepatitis C Antibody  Result Value Ref Range   HCV Ab NEGATIVE NEGATIVE  Prenatal Profile  Result Value Ref Range   WBC 11.9 (H) 3.8 - 10.8 K/uL   RBC 4.37 3.80 - 5.10 MIL/uL   Hemoglobin 12.5 11.7 - 15.5 g/dL   HCT 37.2 35.0 - 45.0 %   MCV 85.1 80.0 - 100.0 fL   MCH 28.6 27.0 - 33.0 pg   MCHC 33.6 32.0 - 36.0 g/dL   RDW 13.7 11.0 - 15.0 %   Platelets 296 140 - 400 K/uL   MPV 10.6 7.5 - 12.5 fL   Neutro Abs 9,401 (H) 1,500 - 7,800 cells/uL   Lymphs Abs 1,666 850 - 3,900 cells/uL   Monocytes Absolute 714 200 - 950 cells/uL   Eosinophils Absolute 119 15 - 500 cells/uL   Basophils Absolute 0 0 - 200 cells/uL   Neutrophils Relative % 79 %   Lymphocytes Relative 14 %   Monocytes Relative 6 %   Eosinophils Relative 1 %   Basophils Relative 0 %   Smear Review Criteria for review not met    Hepatitis B Surface Ag NEGATIVE NEGATIVE   HIV 1&2 Ab, 4th Generation NONREACTIVE NONREACTIVE   RPR Ser Ql NON REAC NON REAC   Rubella 7.99 (H) <0.90 Index   ABO Grouping O    Rh Type POS    Antibody Screen NEG NEGATIVE  POCT Wet + KOH Prep  Result Value Ref Range   Yeast by KOH Absent Present, Absent   Yeast by wet prep Absent Present, Absent   WBC by wet prep Few None, Few, Too numerous to count   Clue Cells Wet Prep HPF POC Few (A) None, Too numerous to count   Trich by wet prep Absent Present, Absent   Bacteria Wet Prep HPF POC Moderate (A) None, Few, Too numerous to count   Epithelial Cells By [employer] Pref ([facility]) Moderate (A) None, Few, Too numerous to count   RBC,UR,HPF,POC None None RBC/hpf  POCT Microscopic Urinalysis ([facility])  Result Value Ref Range   WBC,UR,HPF,POC Few (A) None WBC/hpf   RBC,UR,HPF,POC None None RBC/hpf   Bacteria Moderate (A) None, Too numerous to count   Mucus Absent Absent   Epithelial Cells, UR Per Microscopy Few (A) None, Too numerous to count cells/hpf   Assessment & Plan:  Her urine micro was from a dirty sample, and she was unable to  provide a clean catch at the time of the visit- pt wasn't having any UTI symptoms. 1. Screen for STD (sexually transmitted disease)   2. Encounter for supervision of normal first pregnancy in first trimester   3. Vaginal discharge   Pt is beginning her second trimester and has not yet made  an appointment to see an ob. Advised to do so asap. In the interim, start prenatal mvi, briefly reviewed healthy preg recs.  Orders Placed This Encounter  Procedures  . GC/Chlamydia Probe Amp  . HIV antibody  . RPR  . Hepatitis C Antibody  . Prenatal Profile  . POCT Wet + KOH Prep  . POCT Microscopic Urinalysis ([facility])     I personally performed the services described in this documentation, which was scribed in my presence. The recorded information has been reviewed and considered, and addended by me as needed.   Delman Cheadle, M.D.  [facility] [address] [place], [place] [address] [phone] phone [phone] fax  06/05/16 1:33 AM

## 2016-06-11 ENCOUNTER — Encounter: Payer: Self-pay | Admitting: Family Medicine

## 2016-07-06 ENCOUNTER — Encounter: Payer: Self-pay | Admitting: Obstetrics & Gynecology

## 2016-07-06 ENCOUNTER — Other Ambulatory Visit (INDEPENDENT_AMBULATORY_CARE_PROVIDER_SITE_OTHER): Payer: Managed Care, Other (non HMO)

## 2016-07-06 ENCOUNTER — Ambulatory Visit (INDEPENDENT_AMBULATORY_CARE_PROVIDER_SITE_OTHER): Payer: Managed Care, Other (non HMO) | Admitting: Obstetrics & Gynecology

## 2016-07-06 VITALS — BP 122/81 | HR 93 | Temp 98.6°F | Wt 200.2 lb

## 2016-07-06 DIAGNOSIS — Z3402 Encounter for supervision of normal first pregnancy, second trimester: Secondary | ICD-10-CM | POA: Diagnosis not present

## 2016-07-06 DIAGNOSIS — Z36 Encounter for antenatal screening of mother: Secondary | ICD-10-CM | POA: Diagnosis not present

## 2016-07-06 DIAGNOSIS — O99210 Obesity complicating pregnancy, unspecified trimester: Secondary | ICD-10-CM | POA: Insufficient documentation

## 2016-07-06 DIAGNOSIS — Z3400 Encounter for supervision of normal first pregnancy, unspecified trimester: Secondary | ICD-10-CM | POA: Insufficient documentation

## 2016-07-06 DIAGNOSIS — O0930 Supervision of pregnancy with insufficient antenatal care, unspecified trimester: Secondary | ICD-10-CM | POA: Insufficient documentation

## 2016-07-06 DIAGNOSIS — O26892 Other specified pregnancy related conditions, second trimester: Secondary | ICD-10-CM

## 2016-07-06 DIAGNOSIS — O0932 Supervision of pregnancy with insufficient antenatal care, second trimester: Secondary | ICD-10-CM

## 2016-07-06 DIAGNOSIS — Z0489 Encounter for examination and observation for other specified reasons: Secondary | ICD-10-CM

## 2016-07-06 DIAGNOSIS — O9989 Other specified diseases and conditions complicating pregnancy, childbirth and the puerperium: Secondary | ICD-10-CM

## 2016-07-06 DIAGNOSIS — M549 Dorsalgia, unspecified: Secondary | ICD-10-CM

## 2016-07-06 DIAGNOSIS — IMO0002 Reserved for concepts with insufficient information to code with codable children: Secondary | ICD-10-CM

## 2016-07-06 DIAGNOSIS — Z3689 Encounter for other specified antenatal screening: Secondary | ICD-10-CM

## 2016-07-06 NOTE — Progress Notes (Addendum)
Subjective:    Cristina [name] is a 20 y.o. G1P0 at 13w5dby LMP being seen today for her first obstetrical visit.  Her history is significant for being a teenager, also had history of GC and Chlamydia earlier this year. Patient does intend to breast feed. Pregnancy history fully reviewed.  Patient reports occasional back pain in pregnancy, no other complaints.  Of note, she was seen in Urgent Care on 8/1/2017and underwent STI testing which was negative.  Some prenatal labs were also checked at that visit which were normal; see below. She is accompanied by her FOB today.  Vitals:   07/06/16 0814  BP: 122/81  Pulse: 93  Temp: 98.6 F (37 C)  Weight: 200 lb 3.2 oz (90.8 kg)    HISTORY: OB History  Gravida Para Term Preterm AB Living  1            SAB TAB Ectopic Multiple Live Births               # Outcome Date GA Lbr Len/2nd Weight Sex Delivery Anes PTL Lv  1 Current              Past Medical History:  Diagnosis Date  . Chlamydia infection 08/2015   IUD - Mirena removed 12/2015  . Gonorrhea 08/2015   IUD Mirena removed 12/2015   Past Surgical History:  Procedure Laterality Date  . NO PAST SURGERIES     Family History  Problem Relation Age of Onset  . Diabetes Maternal Grandmother   . Diabetes Maternal Grandfather   . Allergies Neg Hx   . Cancer Neg Hx   . Hyperlipidemia Neg Hx   . Heart disease Neg Hx   . Mental illness Neg Hx   . Mental retardation Neg Hx   . Obesity Neg Hx   . Seizures Neg Hx   . Epilepsy Neg Hx      Exam    Uterus: Gravid  Pelvic Exam: Deferred  System: Breast:  deferred   Skin: normal coloration and turgor, no rashes   Neurologic: oriented, normal, negative   Extremities: normal strength, tone, and muscle mass   HEENT PERRLA, extra ocular movement intact and sclera clear, anicteric   Mouth/Teeth mucous membranes moist, pharynx normal without lesions and dental hygiene good   Neck supple and no masses   Cardiovascular: regular rate  and rhythm   Respiratory:  appears well, vitals normal, no respiratory distress, acyanotic, normal RR, chest clear, no wheezing, crepitations, rhonchi, normal symmetric air entry   Abdomen: soft, non-tender; bowel sounds normal; no masses,  no organomegaly    Labs: Results for orders placed or performed in visit on 05/30/16 (from the past 1008 hour(s))  HIV antibody   Collection Time: 05/30/16 10:03 AM  Result Value Ref Range   HIV 1&2 Ab, 4th Generation NONREACTIVE NONREACTIVE  RPR   Collection Time: 05/30/16 10:03 AM  Result Value Ref Range   RPR Ser Ql NON REAC NON REAC  Hepatitis C Antibody   Collection Time: 05/30/16 10:03 AM  Result Value Ref Range   HCV Ab NEGATIVE NEGATIVE  GC/Chlamydia Probe Amp   Collection Time: 05/30/16 10:10 AM  Result Value Ref Range   CT Probe RNA NOT DETECTED    GC Probe RNA NOT DETECTED   POCT Wet + KOH Prep   Collection Time: 05/30/16 10:26 AM  Result Value Ref Range   Yeast by KOH Absent Present, Absent   Yeast by wet prep  Absent Present, Absent   WBC by wet prep Few None, Few, Too numerous to count   Clue Cells Wet Prep HPF POC Few (A) None, Too numerous to count   Trich by wet prep Absent Present, Absent   Bacteria Wet Prep HPF POC Moderate (A) None, Few, Too numerous to count   Epithelial Cells By [employer] Pref ([facility]) Moderate (A) None, Few, Too numerous to count   RBC,UR,HPF,POC None None RBC/hpf  POCT Microscopic Urinalysis ([facility])   Collection Time: 05/30/16 10:26 AM  Result Value Ref Range   WBC,UR,HPF,POC Few (A) None WBC/hpf   RBC,UR,HPF,POC None None RBC/hpf   Bacteria Moderate (A) None, Too numerous to count   Mucus Absent Absent   Epithelial Cells, UR Per Microscopy Few (A) None, Too numerous to count cells/hpf  Prenatal Profile   Collection Time: 05/30/16 10:29 AM  Result Value Ref Range   WBC 11.9 (H) 3.8 - 10.8 K/uL   RBC 4.37 3.80 - 5.10 MIL/uL   Hemoglobin 12.5 11.7 - 15.5 g/dL   HCT 37.2 35.0 - 45.0 %   MCV 85.1 80.0 -  100.0 fL   MCH 28.6 27.0 - 33.0 pg   MCHC 33.6 32.0 - 36.0 g/dL   RDW 13.7 11.0 - 15.0 %   Platelets 296 140 - 400 K/uL   MPV 10.6 7.5 - 12.5 fL   Neutro Abs 9,401 (H) 1,500 - 7,800 cells/uL   Lymphs Abs 1,666 850 - 3,900 cells/uL   Monocytes Absolute 714 200 - 950 cells/uL   Eosinophils Absolute 119 15 - 500 cells/uL   Basophils Absolute 0 0 - 200 cells/uL   Neutrophils Relative % 79 %   Lymphocytes Relative 14 %   Monocytes Relative 6 %   Eosinophils Relative 1 %   Basophils Relative 0 %   Smear Review Criteria for review not met    Hepatitis B Surface Ag NEGATIVE NEGATIVE   HIV 1&2 Ab, 4th Generation NONREACTIVE NONREACTIVE   RPR Ser Ql NON REAC NON REAC   Rubella 7.99 (H) <0.90 Index   ABO Grouping O    Rh Type POS    Antibody Screen NEG NEGATIVE      Assessment:    Pregnancy: G1P0 Patient Active Problem List   Diagnosis Date Noted  . Supervision of normal first teen pregnancy 07/06/2016  . Late prenatal care starting at [redacted] weeks GA 07/06/2016     Plan:   Remaining prenatal labs drawn. Continue Prenatal vitamins. Advised to use Tylenol for back pain. Problem list reviewed and updated. Genetic Screening discussed Quad Screen: ordered. Ultrasound discussed; fetal survey: ordered and scheduled for today, will follow up results. The nature of [place] with multiple MDs and other Advanced Practice Providers was explained to patient; also emphasized that residents, students are part of our team. Follow up in 4 weeks.  Routine obstetric precautions reviewed.   Osborne Oman, MD 07/06/2016   Addendum Patient had anatomy scan today, dating changed as she is 51w4dtoday. Scan is limited due to her GA. Rescan ordered in 4 weeks.  UOsborne Oman MD

## 2016-07-06 NOTE — Progress Notes (Signed)
Pt c/o lumbago.  

## 2016-07-06 NOTE — Addendum Note (Signed)
Addended by: Jaynie CollinsANYANWU, Xanthe Couillard A on: 07/06/2016 12:30 PM   Modules accepted: Orders

## 2016-07-06 NOTE — Patient Instructions (Addendum)
Back Pain in Pregnancy Back pain during pregnancy is common. It happens in about half of all pregnancies. It is important for you and your baby that you remain active during your pregnancy.If you feel that back pain is not allowing you to remain active or sleep well, it is time to see your caregiver. Back pain may be caused by several factors related to changes during your pregnancy.Fortunately, unless you had trouble with your back before your pregnancy, the pain is likely to get better after you deliver. Low back pain usually occurs between the fifth and seventh months of pregnancy. It can, however, happen in the first couple months. Factors that increase the risk of back problems include:   Previous back problems.  Injury to your back.  Having twins or multiple births.  A chronic cough.  Stress.  Job-related repetitive motions.  Muscle or spinal disease in the back.  Family history of back problems, ruptured (herniated) discs, or osteoporosis.  Depression, anxiety, and panic attacks. CAUSES   When you are pregnant, your body produces a hormone called relaxin. This hormonemakes the ligaments connecting the low back and pubic bones more flexible. This flexibility allows the baby to be delivered more easily. When your ligaments are loose, your muscles need to work harder to support your back. Soreness in your back can come from tired muscles. Soreness can also come from back tissues that are irritated since they are receiving less support.  As the baby grows, it puts pressure on the nerves and blood vessels in your pelvis. This can cause back pain.  As the baby grows and gets heavier during pregnancy, the uterus pushes the stomach muscles forward and changes your center of gravity. This makes your back muscles work harder to maintain good posture. SYMPTOMS  Lumbar pain during pregnancy Lumbar pain during pregnancy usually occurs at or above the waist in the center of the back.  There may be pain and numbness that radiates into your leg or foot. This is similar to low back pain experienced by non-pregnant women. It usually increases with sitting for long periods of time, standing, or repetitive lifting. Tenderness may also be present in the muscles along your upper back. Posterior pelvic pain during pregnancy Pain in the back of the pelvis is more common than lumbar pain in pregnancy. It is a deep pain felt in your side at the waistline, or across the tailbone (sacrum), or in both places. You may have pain on one or both sides. This pain can also go into the buttocks and backs of the upper thighs. Pubic and groin pain may also be present. The pain does not quickly resolve with rest, and morning stiffness may also be present. Pelvic pain during pregnancy can be brought on by most activities. A high level of fitness before and during pregnancy may or may not prevent this problem. Labor pain is usually 1 to 2 minutes apart, lasts for about 1 minute, and involves a bearing down feeling or pressure in your pelvis. However, if you are at term with the pregnancy, constant low back pain can be the beginning of early labor, and you should be aware of this. DIAGNOSIS  X-rays of the back should not be done during the first 12 to 14 weeks of the pregnancy and only when absolutely necessary during the rest of the pregnancy. MRIs do not give off radiation and are safe during pregnancy. MRIs also should only be done when absolutely necessary. HOME CARE INSTRUCTIONS  Exercise  as directed by your caregiver. Exercise is the most effective way to prevent or manage back pain. If you have a back problem, it is especially important to avoid sports that require sudden body movements. Swimming and walking are great activities.  Do not stand in one place for long periods of time.  Do not wear high heels.  Sit in chairs with good posture. Use a pillow on your lower back if necessary. Make sure your  head rests over your shoulders and is not hanging forward.  Try sleeping on your side, preferably the left side, with a pillow or two between your legs. If you are sore after a night's rest, your bedmay betoo soft.Try placing a board between your mattress and box spring.  Listen to your body when lifting.If you are experiencing pain, ask for help or try bending yourknees more so you can use your leg muscles rather than your back muscles. Squat down when picking up something from the floor. Do not bend over.  Eat a healthy diet. Try to gain weight within your caregiver's recommendations.  Use heat or cold packs 3 to 4 times a day for 15 minutes to help with the pain.  Only take over-the-counter or prescription medicines (only Tylenol/Acetaminophen, avoid Ibuprofen or Naproxen) for pain, discomfort, or fever as directed by your caregiver. Sudden (acute) back pain  Use bed rest for only the most extreme, acute episodes of back pain. Prolonged bed rest over 48 hours will aggravate your condition.  Ice is very effective for acute conditions.  Put ice in a plastic bag.  Place a towel between your skin and the bag.  Leave the ice on for 10 to 20 minutes every 2 hours, or as needed.  Using heat packs for 30 minutes prior to activities is also helpful. Continued back pain See your caregiver if you have continued problems. Your caregiver can help or refer you for appropriate physical therapy. With conditioning, most back problems can be avoided. Sometimes, a more serious issue may be the cause of back pain. You should be seen right away if new problems seem to be developing. Your caregiver may recommend:  A maternity girdle.  An elastic sling.  A back brace.  A massage therapist or acupuncture. SEEK MEDICAL CARE IF:   You are not able to do most of your daily activities, even when taking the pain medicine you were given.  You need a referral to a physical therapist or  chiropractor.  You want to try acupuncture. SEEK IMMEDIATE MEDICAL CARE IF:  You develop numbness, tingling, weakness, or problems with the use of your arms or legs.  You develop severe back pain that is no longer relieved with medicines.  You have a sudden change in bowel or bladder control.  You have increasing pain in other areas of the body.  You develop shortness of breath, dizziness, or fainting.  You develop nausea, vomiting, or sweating.  You have back pain which is similar to labor pains.  You have back pain along with your water breaking or vaginal bleeding.  You have back pain or numbness that travels down your leg.  Your back pain developed after you fell.  You develop pain on one side of your back. You may have a kidney stone.  You see blood in your urine. You may have a bladder infection or kidney stone.  You have back pain with blisters. You may have shingles. Back pain is fairly common during pregnancy but should not  be accepted as just part of the process. Back pain should always be treated as soon as possible. This will make your pregnancy as pleasant as possible.   This information is not intended to replace advice given to you by your health care provider. Make sure you discuss any questions you have with your health care provider.   Document Released: 01/24/2006 Document Revised: 01/08/2012 Document Reviewed: 03/07/2011 Elsevier Interactive Patient Education [employer]  Thank you for enrolling in [facility]. Please follow the instructions below to securely access your online medical record. MyChart allows you to send messages to your doctor, view your test results, manage appointments, and more.   How Do I Sign Up? 1. In your Internet browser, go to [employer] and enter [email or web address]. 2. Click on the Sign Up Now link in the Sign In box. You will see the New Member Sign Up page. 3. Enter your MyChart Access Code exactly as it  appears below. You will not need to use this code after you've completed the sign-up process. If you do not sign up before the expiration date, you must request a new code.  MyChart Access Code: [email or web address] Expires: 09/04/2016  9:04 AM  4. Enter your Social Security Number ([email or web address]) and Date of Birth (mm/dd/yyyy) as indicated and click Submit. You will be taken to the next sign-up page. 5. Create a MyChart ID. This will be your MyChart login ID and cannot be changed, so think of one that is secure and easy to remember. 6. Create a MyChart password. You can change your password at any time. 7. Enter your Password Reset Question and Answer. This can be used at a later time if you forget your password.  8. Enter your e-mail address. You will receive e-mail notification when new information is available in MyChart. 9. Click Sign Up. You can now view your medical record.   Additional Information Remember, MyChart is NOT to be used for urgent needs. For medical emergencies, dial 911.     Second Trimester of Pregnancy The second trimester is from week 13 through week 28, months 4 through 6. The second trimester is often a time when you feel your best. Your body has also adjusted to being pregnant, and you begin to feel better physically. Usually, morning sickness has lessened or quit completely, you may have more energy, and you may have an increase in appetite. The second trimester is also a time when the fetus is growing rapidly. At the end of the sixth month, the fetus is about 9 inches long and weighs about 1 pounds. You will likely begin to feel the baby move (quickening) between 18 and 20 weeks of the pregnancy. BODY CHANGES Your body goes through many changes during pregnancy. The changes vary from woman to woman.   Your weight will continue to increase. You will notice your lower abdomen bulging out.  You may begin to get stretch marks on your hips, abdomen, and breasts.  You  may develop headaches that can be relieved by medicines approved by your health care provider.  You may urinate more often because the fetus is pressing on your bladder.  You may develop or continue to have heartburn as a result of your pregnancy.  You may develop constipation because certain hormones are causing the muscles that push waste through your intestines to slow down.  You may develop hemorrhoids or swollen, bulging veins (varicose veins).  You may have back pain because of the weight gain and  pregnancy hormones relaxing your joints between the bones in your pelvis and as a result of a shift in weight and the muscles that support your balance.  Your breasts will continue to grow and be tender.  Your gums may bleed and may be sensitive to brushing and flossing.  Dark spots or blotches (chloasma, mask of pregnancy) may develop on your face. This will likely fade after the baby is born.  A dark line from your belly button to the pubic area (linea nigra) may appear. This will likely fade after the baby is born.  You may have changes in your hair. These can include thickening of your hair, rapid growth, and changes in texture. Some women also have hair loss during or after pregnancy, or hair that feels dry or thin. Your hair will most likely return to normal after your baby is born. WHAT TO EXPECT AT YOUR PRENATAL VISITS During a routine prenatal visit:  You will be weighed to make sure you and the fetus are growing normally.  Your blood pressure will be taken.  Your abdomen will be measured to track your baby's growth.  The fetal heartbeat will be listened to.  Any test results from the previous visit will be discussed. Your health care provider may ask you:  How you are feeling.  If you are feeling the baby move.  If you have had any abnormal symptoms, such as leaking fluid, bleeding, severe headaches, or abdominal cramping.  If you are using any tobacco products,  including cigarettes, chewing tobacco, and electronic cigarettes.  If you have any questions. Other tests that may be performed during your second trimester include:  Blood tests that check for:  Low iron levels (anemia).  Gestational diabetes (between 24 and 28 weeks).  Rh antibodies.  Urine tests to check for infections, diabetes, or protein in the urine.  An ultrasound to confirm the proper growth and development of the baby.  An amniocentesis to check for possible genetic problems.  Fetal screens for spina bifida and Down syndrome.  HIV (human immunodeficiency virus) testing. Routine prenatal testing includes screening for HIV, unless you choose not to have this test. HOME CARE INSTRUCTIONS   Avoid all smoking, herbs, alcohol, and unprescribed drugs. These chemicals affect the formation and growth of the baby.  Do not use any tobacco products, including cigarettes, chewing tobacco, and electronic cigarettes. If you need help quitting, ask your health care provider. You may receive counseling support and other resources to help you quit.  Follow your health care provider's instructions regarding medicine use. There are medicines that are either safe or unsafe to take during pregnancy.  Exercise only as directed by your health care provider. Experiencing uterine cramps is a good sign to stop exercising.  Continue to eat regular, healthy meals.  Wear a good support bra for breast tenderness.  Do not use hot tubs, steam rooms, or saunas.  Wear your seat belt at all times when driving.  Avoid raw meat, uncooked cheese, cat litter boxes, and soil used by cats. These carry germs that can cause birth defects in the baby.  Take your prenatal vitamins.  Take 1500-2000 mg of calcium daily starting at the 20th week of pregnancy until you deliver your baby.  Try taking a stool softener (if your health care provider approves) if you develop constipation. Eat more high-fiber foods,  such as fresh vegetables or fruit and whole grains. Drink plenty of fluids to keep your urine clear or pale yellow.  Take warm sitz baths to soothe any pain or discomfort caused by hemorrhoids. Use hemorrhoid cream if your health care provider approves.  If you develop varicose veins, wear support hose. Elevate your feet for 15 minutes, 3-4 times a day. Limit salt in your diet.  Avoid heavy lifting, wear low heel shoes, and practice good posture.  Rest with your legs elevated if you have leg cramps or low back pain.  Visit your dentist if you have not gone yet during your pregnancy. Use a soft toothbrush to brush your teeth and be gentle when you floss.  A sexual relationship may be continued unless your health care provider directs you otherwise.  Continue to go to all your prenatal visits as directed by your health care provider. SEEK MEDICAL CARE IF:   You have dizziness.  You have mild pelvic cramps, pelvic pressure, or nagging pain in the abdominal area.  You have persistent nausea, vomiting, or diarrhea.  You have a bad smelling vaginal discharge.  You have pain with urination. SEEK IMMEDIATE MEDICAL CARE IF:   You have a fever.  You are leaking fluid from your vagina.  You have spotting or bleeding from your vagina.  You have severe abdominal cramping or pain.  You have rapid weight gain or loss.  You have shortness of breath with chest pain.  You notice sudden or extreme swelling of your face, hands, ankles, feet, or legs.  You have not felt your baby move in over an hour.  You have severe headaches that do not go away with medicine.  You have vision changes.   This information is not intended to replace advice given to you by your health care provider. Make sure you discuss any questions you have with your health care provider.   Document Released: 10/10/2001 Document Revised: 11/06/2014 Document Reviewed: 12/17/2012 Elsevier Interactive Patient Education  [employer]2016 Elsevier Inc.

## 2016-07-09 LAB — URINE CULTURE, OB REFLEX

## 2016-07-09 LAB — CULTURE, OB URINE

## 2016-07-10 LAB — AFP, QUAD SCREEN
DIA Mom Value: 1.09
DIA Value (EIA): 158.97 pg/mL
DSR (By Age)    1 IN: 1166
DSR (Second Trimester) 1 IN: 10000
GESTATIONAL AGE AFP: 17.6 wk
MSAFP MOM: 1.43
MSAFP: 53.4 ng/mL
MSHCG Mom: 1.02
MSHCG: 24854 m[IU]/mL
Maternal Age At EDD: 19.8 YEARS
Osb Risk: 6621
Test Results:: NEGATIVE
UE3 MOM: 1.49
UE3 VALUE: 1.67 ng/mL
Weight: 200 [lb_av]

## 2016-07-10 LAB — HEMOGLOBIN A1C
Est. average glucose Bld gHb Est-mCnc: 103 mg/dL
HEMOGLOBIN A1C: 5.2 % (ref 4.8–5.6)

## 2016-07-10 LAB — HEMOGLOBINOPATHY EVALUATION
HEMOGLOBIN A2 QUANTITATION: 2.6 % (ref 0.7–3.1)
HGB C: 0 %
HGB S: 0 %
Hemoglobin F Quantitation: 1.6 % (ref 0.0–2.0)
Hgb A: 95.8 % (ref 94.0–98.0)

## 2016-07-14 LAB — TOXASSURE SELECT 13 (MW), URINE

## 2016-07-17 ENCOUNTER — Other Ambulatory Visit: Payer: Self-pay | Admitting: Obstetrics

## 2016-07-17 DIAGNOSIS — O283 Abnormal ultrasonic finding on antenatal screening of mother: Secondary | ICD-10-CM

## 2016-07-19 ENCOUNTER — Encounter (HOSPITAL_COMMUNITY): Payer: Self-pay

## 2016-07-19 ENCOUNTER — Ambulatory Visit (HOSPITAL_COMMUNITY)
Admission: RE | Admit: 2016-07-19 | Discharge: 2016-07-19 | Disposition: A | Discharge status: Home / Self Care | Payer: Managed Care, Other (non HMO) | Source: Ambulatory Visit | Attending: Obstetrics | Admitting: Obstetrics

## 2016-07-19 ENCOUNTER — Other Ambulatory Visit: Payer: Self-pay | Admitting: Obstetrics

## 2016-07-19 ENCOUNTER — Encounter: Payer: Self-pay | Admitting: *Deleted

## 2016-07-19 DIAGNOSIS — O283 Abnormal ultrasonic finding on antenatal screening of mother: Secondary | ICD-10-CM | POA: Diagnosis not present

## 2016-07-19 DIAGNOSIS — O358XX0 Maternal care for other (suspected) fetal abnormality and damage, not applicable or unspecified: Secondary | ICD-10-CM | POA: Insufficient documentation

## 2016-07-19 DIAGNOSIS — O289 Unspecified abnormal findings on antenatal screening of mother: Secondary | ICD-10-CM

## 2016-07-19 DIAGNOSIS — O359XX9 Maternal care for (suspected) fetal abnormality and damage, unspecified, other fetus: Secondary | ICD-10-CM

## 2016-07-19 DIAGNOSIS — Z315 Encounter for genetic counseling: Secondary | ICD-10-CM | POA: Insufficient documentation

## 2016-07-19 DIAGNOSIS — Z3A19 19 weeks gestation of pregnancy: Secondary | ICD-10-CM

## 2016-07-19 NOTE — Progress Notes (Addendum)
Genetic Counseling  Visit Summary Note  Appointment Date: 07/19/2016 Referred By: Harper,Charles A  Date of Birth: [birth year]  Pregnancy history: G1P0 Estimated Date of Delivery: 12/10/16 Estimated Gestational Age: [redacted]w[redacted]d  Ms. Cristina [name] and her partner, Cristina [name], were seen for genetic counseling because of abnormal ultrasound findings.    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  The ultrasound report will be sent under separate cover.  We discussed that the second trimester genetic sonogram is targeted at identifying features associated with aneuploidy.  It has evolved as a screening tool used to provide an individualized risk assessment for Down syndrome and other trisomies.  The ability of sonography to aid in the detection of aneuploidies relies on identification of both major structural anomalies and "soft markers."  The patient was counseled that the latter term refers to findings that are often normal variants and do not cause any significant medical problems.  Nonetheless, these markers have a known association with aneuploidy.    The patient was counseled that an EIF is characterized by calcified papillary muscle leading to a discreet dot in the left, or less commonly, the right ventricle.  We discussed that this  finding is typically considered to be a benign variant; however, the risk of aneuploidy is increased when this marker is found in patients who have additional risk factors for fetal aneuploidy (AMA, abnormal screening test, or other markers or anomalies by fetal ultrasound).   We then discussed that the nuchal fold refers to the skin on the back of the fetal neck. A thick nuchal fold measurement is typically defined as greater than 5 mm at 15-[redacted] weeks gestation and greater than 6 mm at 18 through [redacted] weeks gestation. Approximately 1-2% of normal pregnancies will have a thick nuchal fold; therefore, most babies with this finding are born normal and healthy.  However, the presence of a thick nuchal fold increases the chance for Down syndrome in a pregnancy.    Finally, we discussed the echogenic bowel.  They were counseled that an echogenic bowel refers to an area in the fetal intestines that appears brighter, or denser, than the liver and/or bone on ultrasound. Echogenic bowel is detected in approximately 1% of fetuses at the time of a second trimester ultrasound evaluation.  Most of the time, this brightness does not cause any problems and will spontaneously resolve.  Possible causes of an echogenic bowel include undergrowth of the bowel, an obstruction or blockage of the intestines, the fetus swallowing a small amount of blood present in the amniotic fluid, an intrauterine infection, a chromosome condition, cystic fibrosis, or a normal variation in development.   Cristina [name] reported that she has not had bleeding during the pregnancy.  She also denied known exposure to infections during the pregnancy. We reviewed cystic fibrosis (CF) including the typical features and prognosis and the autosomal recessive inheritance of the condition. The carrier frequency  in the African American population is approximately 1 in 8961.  The presence of echogenic bowel on prenatal ultrasound is associated with an increased risk for  CF.  We discussed the option of carrier screening for CF today. Cristina [name] was interested in this option, and blood was drawn for screening today.  We discussed that the presence of all three soft makers would increase the chance for a chromosome condition in the pregnancy to an approximate 1-2% chance.  We reviewed that while her normal Quad screen was reassuring, that screen typically identifies up to 80% of fetuses with Down syndrome, but cannot identify all pregnancies with aneuploidy.    We reviewed chromosomes, nondisjunction, and the common features and variable prognosis of Down syndrome.  We also reviewed other aneuploidies including trisomies 13 and 818; however, we discussed that these conditions are less likely given that the remainder of the fetal anatomy was within normal limits by ultrasound.  We reviewed other available screening and diagnostic options including noninvasive prenatal screening (NIPS)/cell free DNA (cfDNA) screening, and amniocentesis.  She was counseled regarding the benefits and limitations of each option.  We reviewed the approximate 1 in 300-500 risk for complications for amniocentesis, including spontaneous pregnancy loss. We discussed the possible results that the tests might provide including: positive, negative, unanticipated, and no result. Finally, they were counseled regarding the cost of each option and potential out of pocket expenses.  After consideration of all the options, she elected to proceed with NIPS and maternal viral studies also.  Those results will be available in 8-10 days.    Cristina [name] was provided with written information regarding cystic fibrosis (CF), spinal muscular atrophy (SMA) and hemoglobinopathies including the carrier frequency, availability of carrier screening and prenatal diagnosis if indicated.  In addition, we discussed that CF and hemoglobinopathies are routinely screened for as part of the [facility] newborn screening panel.  After further  discussion, she elected to pursue SMA and hemoglobinopathies, in addition to CF.  Both family histories were reviewed and found to be noncontributory for birth defects, mental retardation, and known genetic conditions. Without further information regarding the provided family history, an accurate genetic risk cannot be calculated. Further genetic counseling is warranted if more information is obtained.  Cristina [name] denied exposure to environmental toxins or chemical agents. She denied the use of alcohol, tobacco or street drugs. She denied significant viral illnesses during the course of her pregnancy. Her medical and surgical histories were noncontributory.   I counseled this couple regarding the above risks and available options.  The approximate face-to-face time with the genetic counselor was 50 minutes. Most of the counseling was provided by Vassie MomentKelly Kemak, [employer] genetic counseling student, under my direct supervision.   Azalia Bilisonrad,Juanjesus Pepperman M, MS,  Certified Genetic Counselor

## 2016-07-20 LAB — HSV(HERPES SMPLX)ABS-I+II(IGG+IGM)-BLD

## 2016-07-20 LAB — TOXOPLASMA ANTIBODIES- IGG AND  IGM
Toxoplasma Antibody- IgM: <3 AU/mL (ref 0.0–7.9)
Toxoplasma IgG Ratio: <3 IU/mL (ref 0.0–7.1)

## 2016-07-20 LAB — CMV ANTIBODY, IGG (EIA): CMV Ab - IgG: 1.9 U/mL — ABNORMAL HIGH (ref 0.00–0.59)

## 2016-07-20 LAB — CMV IGM

## 2016-07-27 ENCOUNTER — Ambulatory Visit: Payer: Self-pay

## 2016-07-31 ENCOUNTER — Other Ambulatory Visit (HOSPITAL_COMMUNITY): Payer: Self-pay

## 2016-07-31 ENCOUNTER — Telehealth (HOSPITAL_COMMUNITY): Payer: Self-pay | Admitting: Genetics

## 2016-07-31 NOTE — Telephone Encounter (Signed)
Called Cristina [name] to discuss her prenatal cell free DNA test results.  Ms. Cristina [name] had Panor[facility] testing through [facility].  Testing was offered because of soft markers on ultrasound.   The patient was identified by name and DOB.  We reviewed that these are within normal limits, showing a less than 1 in 10,000 risk for trisomies 21, 18 and 13, and monosomy X (Turner syndrome).  In addition, the risk for triploidy/vanishing twin and sex chromosome trisomies (47,XXX and 47,XXY) was also low risk.  MWe reviewed that this testing identifies > 99% of pregnancies with trisomy 7121, trisomy 6913, sex chromosome trisomies (47,XXX and 47,XXY), and triploidy. The detection rate for trisomy 18 is 96%.  The detection rate for monosomy X is ~92%.  The false positive rate is <0.1% for all conditions. Testing was also consistent with female fetal sex.  The patient did wish to know fetal sex.  She understands that this testing does not identify all genetic conditions.   We also discussed her carrier screening results.  She had carrier screening for the ACOG recommended conditions (SMA, CF, and hemoglobinopathies) through [facility].  We reviewed that the results are negative, indicating that she does not have a detectable gene alteration in any of the genes for which analysis was performed. We reviewed that carrier screening does not detect all carriers of these conditions, but a normal result significantly decreases the likelihood of being a carrier, and therefore, the overall reproductive risk. We reviewed that [facility] sequences most of the genes, which is associated with a high detection rate for carriers, thus a negative screen is very reassuring. All questions were answered to her satisfaction, she was encouraged to call with additional questions or concerns. ? Cristina Gemmaaragh Alixis Harmon, MS Certified Genetic Counselor

## 2016-08-03 ENCOUNTER — Ambulatory Visit: Payer: Managed Care, Other (non HMO)

## 2016-08-03 ENCOUNTER — Ambulatory Visit (INDEPENDENT_AMBULATORY_CARE_PROVIDER_SITE_OTHER): Payer: Managed Care, Other (non HMO) | Admitting: Obstetrics and Gynecology

## 2016-08-03 VITALS — BP 122/80 | HR 93 | Temp 98.7°F | Wt 207.3 lb

## 2016-08-03 DIAGNOSIS — Z3402 Encounter for supervision of normal first pregnancy, second trimester: Secondary | ICD-10-CM

## 2016-08-03 DIAGNOSIS — O0930 Supervision of pregnancy with insufficient antenatal care, unspecified trimester: Secondary | ICD-10-CM

## 2016-08-03 DIAGNOSIS — O0932 Supervision of pregnancy with insufficient antenatal care, second trimester: Secondary | ICD-10-CM

## 2016-08-03 NOTE — Patient Instructions (Signed)
Contraception Choices Contraception (birth control) is the use of any methods or devices to prevent pregnancy. Below are some methods to help avoid pregnancy. HORMONAL METHODS   Contraceptive implant. This is a thin, plastic tube containing progesterone hormone. It does not contain estrogen hormone. Your health care provider inserts the tube in the inner part of the upper arm. The tube can remain in place for up to 3 years. After 3 years, the implant must be removed. The implant prevents the ovaries from releasing an egg (ovulation), thickens the cervical mucus to prevent sperm from entering the uterus, and thins the lining of the inside of the uterus.  Progesterone-only injections. These injections are given every 3 months by your health care provider to prevent pregnancy. This synthetic progesterone hormone stops the ovaries from releasing eggs. It also thickens cervical mucus and changes the uterine lining. This makes it harder for sperm to survive in the uterus.  Birth control pills. These pills contain estrogen and progesterone hormone. They work by preventing the ovaries from releasing eggs (ovulation). They also cause the cervical mucus to thicken, preventing the sperm from entering the uterus. Birth control pills are prescribed by a health care provider.Birth control pills can also be used to treat heavy periods.  Minipill. This type of birth control pill contains only the progesterone hormone. They are taken every day of each month and must be prescribed by your health care provider.  Birth control patch. The patch contains hormones similar to those in birth control pills. It must be changed once a week and is prescribed by a health care provider.  Vaginal ring. The ring contains hormones similar to those in birth control pills. It is left in the vagina for 3 weeks, removed for 1 week, and then a new one is put back in place. The patient must be comfortable inserting and removing the ring  from the vagina.A health care provider's prescription is necessary.  Emergency contraception. Emergency contraceptives prevent pregnancy after unprotected sexual intercourse. This pill can be taken right after sex or up to 5 days after unprotected sex. It is most effective the sooner you take the pills after having sexual intercourse. Most emergency contraceptive pills are available without a prescription. Check with your pharmacist. Do not use emergency contraception as your only form of birth control. BARRIER METHODS   Female condom. This is a thin sheath (latex or rubber) that is worn over the penis during sexual intercourse. It can be used with spermicide to increase effectiveness.  Female condom. This is a soft, loose-fitting sheath that is put into the vagina before sexual intercourse.  Diaphragm. This is a soft, latex, dome-shaped barrier that must be fitted by a health care provider. It is inserted into the vagina, along with a spermicidal jelly. It is inserted before intercourse. The diaphragm should be left in the vagina for 6 to 8 hours after intercourse.  Cervical cap. This is a round, soft, latex or plastic cup that fits over the cervix and must be fitted by a health care provider. The cap can be left in place for up to 48 hours after intercourse.  Sponge. This is a soft, circular piece of polyurethane foam. The sponge has spermicide in it. It is inserted into the vagina after wetting it and before sexual intercourse.  Spermicides. These are chemicals that kill or block sperm from entering the cervix and uterus. They come in the form of creams, jellies, suppositories, foam, or tablets. They do not require a   prescription. They are inserted into the vagina with an applicator before having sexual intercourse. The process must be repeated every time you have sexual intercourse. INTRAUTERINE CONTRACEPTION  Intrauterine device (IUD). This is a T-shaped device that is put in a woman's uterus  during a menstrual period to prevent pregnancy. There are 2 types:  Copper IUD. This type of IUD is wrapped in copper wire and is placed inside the uterus. Copper makes the uterus and fallopian tubes produce a fluid that kills sperm. It can stay in place for 10 years.  Hormone IUD. This type of IUD contains the hormone progestin (synthetic progesterone). The hormone thickens the cervical mucus and prevents sperm from entering the uterus, and it also thins the uterine lining to prevent implantation of a fertilized egg. The hormone can weaken or kill the sperm that get into the uterus. It can stay in place for 3-5 years, depending on which type of IUD is used. PERMANENT METHODS OF CONTRACEPTION  Female tubal ligation. This is when the woman's fallopian tubes are surgically sealed, tied, or blocked to prevent the egg from traveling to the uterus.  Hysteroscopic sterilization. This involves placing a small coil or insert into each fallopian tube. Your doctor uses a technique called hysteroscopy to do the procedure. The device causes scar tissue to form. This results in permanent blockage of the fallopian tubes, so the sperm cannot fertilize the egg. It takes about 3 months after the procedure for the tubes to become blocked. You must use another form of birth control for these 3 months.  Female sterilization. This is when the female has the tubes that carry sperm tied off (vasectomy).This blocks sperm from entering the vagina during sexual intercourse. After the procedure, the man can still ejaculate fluid (semen). NATURAL PLANNING METHODS  Natural family planning. This is not having sexual intercourse or using a barrier method (condom, diaphragm, cervical cap) on days the woman could become pregnant.  Calendar method. This is keeping track of the length of each menstrual cycle and identifying when you are fertile.  Ovulation method. This is avoiding sexual intercourse during ovulation.  Symptothermal  method. This is avoiding sexual intercourse during ovulation, using a thermometer and ovulation symptoms.  Post-ovulation method. This is timing sexual intercourse after you have ovulated. Regardless of which type or method of contraception you choose, it is important that you use condoms to protect against the transmission of sexually transmitted infections (STIs). Talk with your health care provider about which form of contraception is most appropriate for you.   This information is not intended to replace advice given to you by your health care provider. Make sure you discuss any questions you have with your health care provider.   Document Released: 10/16/2005 Document Revised: 10/21/2013 Document Reviewed: 04/10/2013 Elsevier Interactive Patient Education 2016 Elsevier Inc.  

## 2016-08-03 NOTE — Progress Notes (Signed)
   PRENATAL VISIT NOTE  Subjective:  Cristina [name] is a 20 y.o. G1P0 at 5074w4d being seen today for ongoing prenatal care.  She is currently monitored for the following issues for this low-risk pregnancy and has Supervision of normal first teen pregnancy and Late prenatal care starting at [redacted] weeks GA on her problem list.  Patient reports no complaints.  Contractions: Not present. Vag. Bleeding: None.  Movement: Present. Denies leaking of fluid.   The following portions of the patient's history were reviewed and updated as appropriate: allergies, current medications, past family history, past medical history, past social history, past surgical history and problem list. Problem list updated.  Objective:   Vitals:   08/03/16 1020  BP: 122/80  Pulse: 93  Temp: 98.7 F (37.1 C)  Weight: 207 lb 4.8 oz (94 kg)    Fetal Status: Fetal Heart Rate (bpm): 156 Fundal Height: 21 cm Movement: Present     General:  Alert, oriented and cooperative. Patient is in no acute distress.  Skin: Skin is warm and dry. No rash noted.   Cardiovascular: Normal heart rate noted  Respiratory: Normal respiratory effort, no problems with respiration noted  Abdomen: Soft, gravid, appropriate for gestational age. Pain/Pressure: Absent     Pelvic:  Cervical exam deferred        Extremities: Normal range of motion.  Edema: Trace  Mental Status: Normal mood and affect. Normal behavior. Normal judgment and thought content.   Urinalysis:      Assessment and Plan:  Pregnancy: G1P0 at 8874w4d  1. Supervision of normal first teen pregnancy in second trimester Patient is doing well without complaints Follow up anatomy ultrasound reviewed with the patient Blood work also reviewed. Panorama pending [facility] to recent [facility] ultrasound. Patient declined flu vaccine  2. Late prenatal care starting at [redacted] weeks GA   Preterm labor  symptoms and general obstetric precautions including but not limited to vaginal bleeding, contractions, leaking of fluid and fetal movement were reviewed in detail with the patient. Please refer to After Visit Summary for other counseling recommendations.  Return in about 4 weeks (around 08/31/2016).  Catalina AntiguaPeggy Elliott Lasecki, MD

## 2016-08-07 ENCOUNTER — Other Ambulatory Visit (HOSPITAL_COMMUNITY): Payer: Self-pay

## 2016-08-08 ENCOUNTER — Encounter (HOSPITAL_COMMUNITY): Payer: Self-pay

## 2016-08-31 ENCOUNTER — Ambulatory Visit (INDEPENDENT_AMBULATORY_CARE_PROVIDER_SITE_OTHER): Payer: Managed Care, Other (non HMO) | Admitting: Obstetrics & Gynecology

## 2016-08-31 DIAGNOSIS — Z3400 Encounter for supervision of normal first pregnancy, unspecified trimester: Secondary | ICD-10-CM

## 2016-08-31 DIAGNOSIS — Z3402 Encounter for supervision of normal first pregnancy, second trimester: Secondary | ICD-10-CM

## 2016-08-31 NOTE — Progress Notes (Signed)
   PRENATAL VISIT NOTE  Subjective:  Cristina [name] is a 20 y.o. G1P0 at 5262w4d being seen today for ongoing prenatal care.  She is currently monitored for the following issues for this low-risk pregnancy and has Supervision of normal first pregnancy, antepartum and Late prenatal care starting at [redacted] weeks GA on her problem list.  .  Contractions: Irritability. Vag. Bleeding: None.  Movement: Present. Denies leaking of fluid.   The following portions of the patient's history were reviewed and updated as appropriate: allergies, current medications, past family history, past medical history, past social history, past surgical history and problem list. Problem list updated.  Objective:   Vitals:   08/31/16 0931  BP: 116/75  Pulse: 87  Weight: 218 lb (98.9 kg)    Fetal Status:     Movement: Present     General:  Alert, oriented and cooperative. Patient is in no acute distress.  Skin: Skin is warm and dry. No rash noted.   Cardiovascular: Normal heart rate noted  Respiratory: Normal respiratory effort, no problems with respiration noted  Abdomen: Soft, gravid, appropriate for gestational age. Pain/Pressure: Absent     Pelvic:  Cervical exam deferred        Extremities: Normal range of motion.     Mental Status: Normal mood and affect. Normal behavior. Normal judgment and thought content.   Assessment and Plan:  Pregnancy: G1P0 at 7062w4d  1. Supervision of normal first pregnancy, antepartum 2 hr GTT NV  Preterm labor symptoms and general obstetric precautions including but not limited to vaginal bleeding, contractions, leaking of fluid and fetal movement were reviewed in detail with the patient. Please refer to After Visit Summary for other counseling recommendations.  Return in about 2 weeks (around 09/14/2016) for 2 hr gtt.  Adam PhenixJames G Jaylina Ramdass, MD

## 2016-08-31 NOTE — Patient Instructions (Signed)

## 2016-09-14 ENCOUNTER — Ambulatory Visit (INDEPENDENT_AMBULATORY_CARE_PROVIDER_SITE_OTHER): Payer: Managed Care, Other (non HMO) | Admitting: Obstetrics & Gynecology

## 2016-09-14 DIAGNOSIS — Z3400 Encounter for supervision of normal first pregnancy, unspecified trimester: Secondary | ICD-10-CM

## 2016-09-14 DIAGNOSIS — Z3402 Encounter for supervision of normal first pregnancy, second trimester: Secondary | ICD-10-CM

## 2016-09-14 NOTE — Progress Notes (Signed)
Patient states that she feels good, reports good fetal movement. 

## 2016-09-14 NOTE — Progress Notes (Signed)
   PRENATAL VISIT NOTE  Subjective:  Cristina [name] is a 20 y.o. G1P0 at 8813w4d being seen today for ongoing prenatal care.  She is currently monitored for the following issues for this low-risk pregnancy and has Supervision of normal first pregnancy, antepartum and Late prenatal care starting at [redacted] weeks GA on her problem list.  Patient reports no complaints.  Contractions: Not present. Vag. Bleeding: None.  Movement: Present. Denies leaking of fluid.   The following portions of the patient's history were reviewed and updated as appropriate: allergies, current medications, past family history, past medical history, past social history, past surgical history and problem list. Problem list updated.  Objective:   Vitals:   09/14/16 1347  BP: 123/76  Pulse: 94  Temp: 98.5 F (36.9 C)  Weight: 222 lb 9.6 oz (101 kg)    Fetal Status: Fetal Heart Rate (bpm): 145 Fundal Height: 29 cm Movement: Present     General:  Alert, oriented and cooperative. Patient is in no acute distress.  Skin: Skin is warm and dry. No rash noted.   Cardiovascular: Normal heart rate noted  Respiratory: Normal respiratory effort, no problems with respiration noted  Abdomen: Soft, gravid, appropriate for gestational age. Pain/Pressure: Absent     Pelvic:  Cervical exam deferred        Extremities: Normal range of motion.  Edema: Trace  Mental Status: Normal mood and affect. Normal behavior. Normal judgment and thought content.   Assessment and Plan:  Pregnancy: G1P0 at 7313w4d  1. Supervision of normal first pregnancy, antepartum 2 hr GTT  Preterm labor symptoms and general obstetric precautions including but not limited to vaginal bleeding, contractions, leaking of fluid and fetal movement were reviewed in detail with the patient. Please refer to After Visit Summary for other counseling recommendations.  Return in about 2 weeks (around 09/28/2016).   Adam PhenixJames G Al Gagen, MD

## 2016-09-15 LAB — CBC
HEMATOCRIT: 34 % (ref 34.0–46.6)
Hemoglobin: 11.1 g/dL (ref 11.1–15.9)
MCH: 28 pg (ref 26.6–33.0)
MCHC: 32.6 g/dL (ref 31.5–35.7)
MCV: 86 fL (ref 79–97)
PLATELETS: 250 10*3/uL (ref 150–379)
RBC: 3.97 x10E6/uL (ref 3.77–5.28)
RDW: 13.4 % (ref 12.3–15.4)
WBC: 14.1 10*3/uL — AB (ref 3.4–10.8)

## 2016-09-15 LAB — HIV ANTIBODY (ROUTINE TESTING W REFLEX): HIV SCREEN 4TH GENERATION: NONREACTIVE

## 2016-09-15 LAB — GLUCOSE TOLERANCE, 2 HOURS W/ 1HR
GLUCOSE, 1 HOUR: 124 mg/dL (ref 65–179)
GLUCOSE, FASTING: 89 mg/dL (ref 65–91)
Glucose, 2 hour: 111 mg/dL (ref 65–152)

## 2016-09-15 LAB — RPR: RPR Ser Ql: NONREACTIVE

## 2016-10-03 ENCOUNTER — Ambulatory Visit (INDEPENDENT_AMBULATORY_CARE_PROVIDER_SITE_OTHER): Payer: Managed Care, Other (non HMO) | Admitting: Obstetrics and Gynecology

## 2016-10-03 DIAGNOSIS — Z23 Encounter for immunization: Secondary | ICD-10-CM

## 2016-10-03 DIAGNOSIS — Z3483 Encounter for supervision of other normal pregnancy, third trimester: Secondary | ICD-10-CM

## 2016-10-03 DIAGNOSIS — O283 Abnormal ultrasonic finding on antenatal screening of mother: Secondary | ICD-10-CM

## 2016-10-03 NOTE — Progress Notes (Signed)
Subjective:  Cristina [name] is a 20 y.o. G1P0 at 3095w2d being seen today for ongoing prenatal care.  She is currently monitored for the following issues for this high-risk pregnancy and has Supervision of normal first pregnancy, antepartum; Late prenatal care starting at [redacted] weeks GA; and Abnormal fetal ultrasound on her problem list.  Patient reports no complaints.  Contractions: Not present. Vag. Bleeding: None.  Movement: Present. Denies leaking of fluid.   The following portions of the patient's history were reviewed and updated as appropriate: allergies, current medications, past family history, past medical history, past social history, past surgical history and problem list. Problem list updated.  Objective:   Vitals:   10/03/16 1435  BP: 127/77  Pulse: (!) 102  Weight: 226 lb (102.5 kg)    Fetal Status: Fetal Heart Rate (bpm): 160   Movement: Present     General:  Alert, oriented and cooperative. Patient is in no acute distress.  Skin: Skin is warm and dry. No rash noted.   Cardiovascular: Normal heart rate noted  Respiratory: Normal respiratory effort, no problems with respiration noted  Abdomen: Soft, gravid, appropriate for gestational age. Pain/Pressure: Absent     Pelvic:  Cervical exam deferred        Extremities: Normal range of motion.  Edema: None  Mental Status: Normal mood and affect. Normal behavior. Normal judgment and thought content.   Urinalysis:      Assessment and Plan:  Pregnancy: G1P0 at 5095w2d Declines Flu vaccine Tdap vaccine today  1. Abnormal fetal ultrasound Panorama results low risk. Toxo/HSV/CMV testing negative F/U U/S ordered  Preterm labor symptoms and general obstetric precautions including but not limited to vaginal bleeding, contractions, leaking of fluid and fetal movement were reviewed in detail with the patient. Please refer to After Visit Summary for other counseling recommendations.  No Follow-up on file.   Hermina StaggersMichael L Analeise Mccleery, MD

## 2016-10-03 NOTE — Progress Notes (Signed)
Patient reports no concerns today 

## 2016-10-11 ENCOUNTER — Ambulatory Visit (HOSPITAL_COMMUNITY)
Admission: RE | Admit: 2016-10-11 | Discharge: 2016-10-11 | Disposition: A | Discharge status: Home / Self Care | Payer: Managed Care, Other (non HMO) | Source: Ambulatory Visit | Attending: Obstetrics and Gynecology | Admitting: Obstetrics and Gynecology

## 2016-10-11 ENCOUNTER — Encounter (HOSPITAL_COMMUNITY): Payer: Self-pay

## 2016-10-11 DIAGNOSIS — O283 Abnormal ultrasonic finding on antenatal screening of mother: Secondary | ICD-10-CM | POA: Diagnosis present

## 2016-10-11 DIAGNOSIS — O359XX0 Maternal care for (suspected) fetal abnormality and damage, unspecified, not applicable or unspecified: Secondary | ICD-10-CM | POA: Insufficient documentation

## 2016-10-11 DIAGNOSIS — Z3A31 31 weeks gestation of pregnancy: Secondary | ICD-10-CM | POA: Diagnosis not present

## 2016-10-25 ENCOUNTER — Encounter: Payer: Managed Care, Other (non HMO) | Admitting: Obstetrics and Gynecology

## 2016-10-31 ENCOUNTER — Encounter: Payer: Self-pay | Admitting: Obstetrics

## 2016-10-31 ENCOUNTER — Ambulatory Visit (INDEPENDENT_AMBULATORY_CARE_PROVIDER_SITE_OTHER): Payer: Managed Care, Other (non HMO) | Admitting: Obstetrics

## 2016-10-31 VITALS — BP 121/75 | HR 86 | Wt 238.0 lb

## 2016-10-31 DIAGNOSIS — Z3480 Encounter for supervision of other normal pregnancy, unspecified trimester: Secondary | ICD-10-CM

## 2016-10-31 DIAGNOSIS — Z3400 Encounter for supervision of normal first pregnancy, unspecified trimester: Secondary | ICD-10-CM

## 2016-10-31 DIAGNOSIS — Z3483 Encounter for supervision of other normal pregnancy, third trimester: Secondary | ICD-10-CM

## 2016-10-31 NOTE — Progress Notes (Signed)
Subjective:    Cristina [name] is a 21 y.o. female being seen today for her obstetrical visit. She is at 2478w2d gestation. Patient reports no complaints. Fetal movement: normal.  Problem List Items Addressed This Visit    None     Patient Active Problem List   Diagnosis Date Noted  . Abnormal fetal ultrasound 10/03/2016  . Supervision of normal first pregnancy, antepartum 07/06/2016  . Late prenatal care starting at [redacted] weeks GA 07/06/2016   Objective:    BP 121/75   Pulse 86   Wt 238 lb (108 kg)   LMP 01/29/2016 (Approximate)   BMI 39.61 kg/m  FHT:  150 BPM  Uterine Size: size equals dates  Presentation: unsure     Assessment:    Pregnancy @ 6578w2d weeks   Plan:     labs reviewed, problem list updated Consent signed. GBS sent TDAP offered  Rhogam given for RH negative Pediatrician: discussed. Infant feeding: plans to breastfeed. Maternity leave: discussed. Cigarette smoking: never smoked. No orders of the defined types were placed in this encounter.  No orders of the defined types were placed in this encounter.  Follow up in 1 Week.   Patient ID: Cristina CardKeyana A Godsil, female   DOB: January 11, 1996, 21 y.o.   MRN: [id number]

## 2016-11-07 ENCOUNTER — Encounter: Payer: Self-pay | Admitting: Obstetrics

## 2016-11-07 ENCOUNTER — Ambulatory Visit (INDEPENDENT_AMBULATORY_CARE_PROVIDER_SITE_OTHER): Payer: Managed Care, Other (non HMO) | Admitting: Obstetrics

## 2016-11-07 VITALS — BP 118/77 | HR 96 | Wt 238.0 lb

## 2016-11-07 DIAGNOSIS — Z3400 Encounter for supervision of normal first pregnancy, unspecified trimester: Secondary | ICD-10-CM

## 2016-11-07 DIAGNOSIS — Z3483 Encounter for supervision of other normal pregnancy, third trimester: Secondary | ICD-10-CM

## 2016-11-07 DIAGNOSIS — Z3480 Encounter for supervision of other normal pregnancy, unspecified trimester: Secondary | ICD-10-CM

## 2016-11-07 LAB — OB RESULTS CONSOLE GBS: STREP GROUP B AG: POSITIVE

## 2016-11-07 NOTE — Progress Notes (Signed)
Subjective:    Cristina [name] is a 21 y.o. female being seen today for her obstetrical visit. She is at 6633w2d gestation. Patient reports no complaints. Fetal movement: normal.  Problem List Items Addressed This Visit    Supervision of normal first pregnancy, antepartum   Relevant Orders   Culture, beta strep (group b only)     Patient Active Problem List   Diagnosis Date Noted  . Abnormal fetal ultrasound 10/03/2016  . Supervision of normal first pregnancy, antepartum 07/06/2016  . Late prenatal care starting at [redacted] weeks GA 07/06/2016   Objective:    BP 118/77   Pulse 96   Wt 238 lb (108 kg)   LMP 01/29/2016 (Approximate)   BMI 39.61 kg/m  FHT:  150 BPM  Uterine Size: size equals dates  Presentation: unsure     Assessment:    Pregnancy @ 4633w2d weeks   Plan:     labs reviewed, problem list updated Consent signed. GBS sent TDAP offered  Rhogam given for RH negative Pediatrician: discussed. Infant feeding: plans to breastfeed. Maternity leave: discussed. Cigarette smoking: never smoked. Orders Placed This Encounter  Procedures  . Culture, beta strep (group b only)   No orders of the defined types were placed in this encounter.  Follow up in 1 Week.   Patient ID: Cristina [name], female   DOB: 1996/03/23, 21 y.o.   MRN: [id number]

## 2016-11-10 LAB — CULTURE, BETA STREP (GROUP B ONLY): STREP GP B CULTURE: POSITIVE — AB

## 2016-11-17 ENCOUNTER — Ambulatory Visit (INDEPENDENT_AMBULATORY_CARE_PROVIDER_SITE_OTHER): Payer: Managed Care, Other (non HMO) | Admitting: Certified Nurse Midwife

## 2016-11-17 VITALS — BP 129/82 | HR 105 | Wt 237.5 lb

## 2016-11-17 DIAGNOSIS — O283 Abnormal ultrasonic finding on antenatal screening of mother: Secondary | ICD-10-CM

## 2016-11-17 DIAGNOSIS — Z3400 Encounter for supervision of normal first pregnancy, unspecified trimester: Secondary | ICD-10-CM

## 2016-11-17 DIAGNOSIS — B951 Streptococcus, group B, as the cause of diseases classified elsewhere: Secondary | ICD-10-CM

## 2016-11-17 DIAGNOSIS — O0930 Supervision of pregnancy with insufficient antenatal care, unspecified trimester: Secondary | ICD-10-CM

## 2016-11-17 DIAGNOSIS — Z3403 Encounter for supervision of normal first pregnancy, third trimester: Secondary | ICD-10-CM

## 2016-11-17 NOTE — Progress Notes (Signed)
   PRENATAL VISIT NOTE  Subjective:  Cristina [name] is a 21 y.o. G1P0 at 862w5d being seen today for ongoing prenatal care.  She is currently monitored for the following issues for this low-risk pregnancy and has Supervision of normal first pregnancy, antepartum; Late prenatal care starting at [redacted] weeks GA; Abnormal fetal ultrasound; and Positive GBS test on her problem list.  Patient reports no complaints.  Contractions: Irregular. Vag. Bleeding: None.  Movement: Present. Denies leaking of fluid.   The following portions of the patient's history were reviewed and updated as appropriate: allergies, current medications, past family history, past medical history, past social history, past surgical history and problem list. Problem list updated.  Objective:   Vitals:   11/17/16 1018  BP: 129/82  Pulse: (!) 105  Weight: 237 lb 8 oz (107.7 kg)    Fetal Status: Fetal Heart Rate (bpm): 148 Fundal Height: 38 cm Movement: Present  Presentation: Vertex  General:  Alert, oriented and cooperative. Patient is in no acute distress.  Skin: Skin is warm and dry. No rash noted.   Cardiovascular: Normal heart rate noted  Respiratory: Normal respiratory effort, no problems with respiration noted  Abdomen: Soft, gravid, appropriate for gestational age. Pain/Pressure: Present     Pelvic:  Cervical exam performed        Extremities: Normal range of motion.  Edema: Trace  Mental Status: Normal mood and affect. Normal behavior. Normal judgment and thought content.   Assessment and Plan:  Pregnancy: G1P0 at 3362w5d  1. Abnormal fetal ultrasound      Echogenic focus, growth and panorama WNL.  Probable LGA.    2. Late prenatal care starting at [redacted] weeks GA     Given information for peds list, [facility], birthing classes  3. Supervision of normal first pregnancy, antepartum     Doing well  4. Positive GBS test     Antibiotics for labor, PCN  Preterm labor symptoms and general obstetric precautions including  but not limited to vaginal bleeding, contractions, leaking of fluid and fetal movement were reviewed in detail with the patient. Please refer to After Visit Summary for other counseling recommendations.  Return in about 1 week (around 11/24/2016) for ROB.   Roe Coombsachelle A Mannix Kroeker, CNM

## 2016-11-17 NOTE — Patient Instructions (Addendum)
Third Trimester of Pregnancy The third trimester is from week 29 through week 40 (months 7 through 9). The third trimester is a time when the unborn baby (fetus) is growing rapidly. At the end of the ninth month, the fetus is about 20 inches in length and weighs 6-10 pounds. Body changes during your third trimester Your body goes through many changes during pregnancy. The changes vary from woman to woman. During the third trimester:  Your weight will continue to increase. You can expect to gain 25-35 pounds (11-16 kg) by the end of the pregnancy.  You may begin to get stretch marks on your hips, abdomen, and breasts.  You may urinate more often because the fetus is moving lower into your pelvis and pressing on your bladder.  You may develop or continue to have heartburn. This is caused by increased hormones that slow down muscles in the digestive tract.  You may develop or continue to have constipation because increased hormones slow digestion and cause the muscles that push waste through your intestines to relax.  You may develop hemorrhoids. These are swollen veins (varicose veins) in the rectum that can itch or be painful.  You may develop swollen, bulging veins (varicose veins) in your legs.  You may have increased body aches in the pelvis, back, or thighs. This is due to weight gain and increased hormones that are relaxing your joints.  You may have changes in your hair. These can include thickening of your hair, rapid growth, and changes in texture. Some women also have hair loss during or after pregnancy, or hair that feels dry or thin. Your hair will most likely return to normal after your baby is born.  Your breasts will continue to grow and they will continue to become tender. A yellow fluid (colostrum) may leak from your breasts. This is the first milk you are producing for your baby.  Your belly button may stick out.  You may notice more swelling in your hands, face, or  ankles.  You may have increased tingling or numbness in your hands, arms, and legs. The skin on your belly may also feel numb.  You may feel short of breath because of your expanding uterus.  You may have more problems sleeping. This can be caused by the size of your belly, increased need to urinate, and an increase in your body's metabolism.  You may notice the fetus "dropping," or moving lower in your abdomen.  You may have increased vaginal discharge.  Your cervix becomes thin and soft (effaced) near your due date. What to expect at prenatal visits You will have prenatal exams every 2 weeks until week 36. Then you will have weekly prenatal exams. During a routine prenatal visit:  You will be weighed to make sure you and the fetus are growing normally.  Your blood pressure will be taken.  Your abdomen will be measured to track your baby's growth.  The fetal heartbeat will be listened to.  Any test results from the previous visit will be discussed.  You may have a cervical check near your due date to see if you have effaced. At around 36 weeks, your health care provider will check your cervix. At the same time, your health care provider will also perform a test on the secretions of the vaginal tissue. This test is to determine if a type of bacteria, Group B streptococcus, is present. Your health care provider will explain this further. Your health care provider may ask you:    What your birth plan is.  How you are feeling.  If you are feeling the baby move.  If you have had any abnormal symptoms, such as leaking fluid, bleeding, severe headaches, or abdominal cramping.  If you are using any tobacco products, including cigarettes, chewing tobacco, and electronic cigarettes.  If you have any questions. Other tests or screenings that may be performed during your third trimester include:  Blood tests that check for low iron levels (anemia).  Fetal testing to check the health,  activity level, and growth of the fetus. Testing is done if you have certain medical conditions or if there are problems during the pregnancy.  Nonstress test (NST). This test checks the health of your baby to make sure there are no signs of problems, such as the baby not getting enough oxygen. During this test, a belt is placed around your belly. The baby is made to move, and its heart rate is monitored during movement. What is false labor? False labor is a condition in which you feel small, irregular tightenings of the muscles in the womb (contractions) that eventually go away. These are called Braxton Hicks contractions. Contractions may last for hours, days, or even weeks before true labor sets in. If contractions come at regular intervals, become more frequent, increase in intensity, or become painful, you should see your health care provider. What are the signs of labor?  Abdominal cramps.  Regular contractions that start at 10 minutes apart and become stronger and more frequent with time.  Contractions that start on the top of the uterus and spread down to the lower abdomen and back.  Increased pelvic pressure and dull back pain.  A watery or bloody mucus discharge that comes from the vagina.  Leaking of amniotic fluid. This is also known as your "water breaking." It could be a slow trickle or a gush. Let your doctor know if it has a color or strange odor. If you have any of these signs, call your health care provider right away, even if it is before your due date. Follow these instructions at home: Eating and drinking  Continue to eat regular, healthy meals.  Do not eat:  Raw meat or meat spreads.  Unpasteurized milk or cheese.  Unpasteurized juice.  Store-made salad.  Refrigerated smoked seafood.  Hot dogs or deli meat, unless they are piping hot.  More than 6 ounces of albacore tuna a week.  Shark, swordfish, king mackerel, or tile fish.  Store-made salads.  Raw  sprouts, such as mung bean or alfalfa sprouts.  Take prenatal vitamins as told by your health care provider.  Take 1000 mg of calcium daily as told by your health care provider.  If you develop constipation:  Take over-the-counter or prescription medicines.  Drink enough fluid to keep your urine clear or pale yellow.  Eat foods that are high in fiber, such as fresh fruits and vegetables, whole grains, and beans.  Limit foods that are high in fat and processed sugars, such as fried and sweet foods. Activity  Exercise only as directed by your health care provider. Healthy pregnant women should aim for 2 hours and 30 minutes of moderate exercise per week. If you experience any pain or discomfort while exercising, stop.  Avoid heavy lifting.  Do not exercise in extreme heat or humidity, or at high altitudes.  Wear low-heel, comfortable shoes.  Practice good posture.  Do not travel far distances unless it is absolutely necessary and only with the approval   of your health care provider.  Wear your seat belt at all times while in a car, on a bus, or on a plane.  Take frequent breaks and rest with your legs elevated if you have leg cramps or low back pain.  Do not use hot tubs, steam rooms, or saunas.  You may continue to have sex unless your health care provider tells you otherwise. Lifestyle  Do not use any products that contain nicotine or tobacco, such as cigarettes and e-cigarettes. If you need help quitting, ask your health care provider.  Do not drink alcohol.  Do not use any medicinal herbs or unprescribed drugs. These chemicals affect the formation and growth of the baby.  If you develop varicose veins:  Wear support pantyhose or compression stockings as told by your healthcare provider.  Elevate your feet for 15 minutes, 3-4 times a day.  Wear a supportive maternity bra to help with breast tenderness. General instructions  Take over-the-counter and prescription  medicines only as told by your health care provider. There are medicines that are either safe or unsafe to take during pregnancy.  Take warm sitz baths to soothe any pain or discomfort caused by hemorrhoids. Use hemorrhoid cream or witch hazel if your health care provider approves.  Avoid cat litter boxes and soil used by cats. These carry germs that can cause birth defects in the baby. If you have a cat, ask someone to clean the litter box for you.  To prepare for the arrival of your baby:  Take prenatal classes to understand, practice, and ask questions about the labor and delivery.  Make a trial run to the hospital.  Visit the hospital and tour the maternity area.  Arrange for maternity or paternity leave through employers.  Arrange for family and friends to take care of pets while you are in the hospital.  Purchase a rear-facing car seat and make sure you know how to install it in your car.  Pack your hospital bag.  Prepare the baby's nursery. Make sure to remove all pillows and stuffed animals from the baby's crib to prevent suffocation.  Visit your dentist if you have not gone during your pregnancy. Use a soft toothbrush to brush your teeth and be gentle when you floss.  Keep all prenatal follow-up visits as told by your health care provider. This is important. Contact a health care provider if:  You are unsure if you are in labor or if your water has broken.  You become dizzy.  You have mild pelvic cramps, pelvic pressure, or nagging pain in your abdominal area.  You have lower back pain.  You have persistent nausea, vomiting, or diarrhea.  You have an unusual or bad smelling vaginal discharge.  You have pain when you urinate. Get help right away if:  You have a fever.  You are leaking fluid from your vagina.  You have spotting or bleeding from your vagina.  You have severe abdominal pain or cramping.  You have rapid weight loss or weight gain.  You have  shortness of breath with chest pain.  You notice sudden or extreme swelling of your face, hands, ankles, feet, or legs.  Your baby makes fewer than 10 movements in 2 hours.  You have severe headaches that do not go away with medicine.  You have vision changes. Summary  The third trimester is from week 29 through week 40, months 7 through 9. The third trimester is a time when the unborn baby (fetus)   is growing rapidly.  During the third trimester, your discomfort may increase as you and your baby continue to gain weight. You may have abdominal, leg, and back pain, sleeping problems, and an increased need to urinate.  During the third trimester your breasts will keep growing and they will continue to become tender. A yellow fluid (colostrum) may leak from your breasts. This is the first milk you are producing for your baby.  False labor is a condition in which you feel small, irregular tightenings of the muscles in the womb (contractions) that eventually go away. These are called Braxton Hicks contractions. Contractions may last for hours, days, or even weeks before true labor sets in.  Signs of labor can include: abdominal cramps; regular contractions that start at 10 minutes apart and become stronger and more frequent with time; watery or bloody mucus discharge that comes from the vagina; increased pelvic pressure and dull back pain; and leaking of amniotic fluid. This information is not intended to replace advice given to you by your health care provider. Make sure you discuss any questions you have with your health care provider. Document Released: 10/10/2001 Document Revised: 03/23/2016 Document Reviewed: 12/17/2012 Elsevier Interactive Patient Education  2017 [employer]  

## 2016-11-27 ENCOUNTER — Ambulatory Visit (INDEPENDENT_AMBULATORY_CARE_PROVIDER_SITE_OTHER): Payer: Managed Care, Other (non HMO) | Admitting: Obstetrics and Gynecology

## 2016-11-27 DIAGNOSIS — Z3403 Encounter for supervision of normal first pregnancy, third trimester: Secondary | ICD-10-CM

## 2016-11-27 DIAGNOSIS — O283 Abnormal ultrasonic finding on antenatal screening of mother: Secondary | ICD-10-CM

## 2016-11-27 DIAGNOSIS — Z3400 Encounter for supervision of normal first pregnancy, unspecified trimester: Secondary | ICD-10-CM

## 2016-11-27 DIAGNOSIS — O99820 Streptococcus B carrier state complicating pregnancy: Secondary | ICD-10-CM

## 2016-11-27 NOTE — Progress Notes (Signed)
Patient is in the office, reports good fetal movement. 

## 2016-11-27 NOTE — Progress Notes (Signed)
   PRENATAL VISIT NOTE  Subjective:  Cristina [name] is a 21 y.o. G1P0 at 3764w1d being seen today for ongoing prenatal care.  She is currently monitored for the following issues for this low-risk pregnancy and has Supervision of normal first pregnancy, antepartum; Late prenatal care starting at [redacted] weeks GA; Abnormal fetal ultrasound; Positive GBS test; and GBS (group B Streptococcus carrier), +RV culture, currently pregnant on her problem list.  Patient reports no complaints.  Contractions: Irregular. Vag. Bleeding: None.  Movement: Present. Denies leaking of fluid.   The following portions of the patient's history were reviewed and updated as appropriate: allergies, current medications, past family history, past medical history, past social history, past surgical history and problem list. Problem list updated.  Objective:   Vitals:   11/27/16 1347  BP: 120/82  Pulse: (!) 105  Weight: 242 lb (109.8 kg)    Fetal Status: Fetal Heart Rate (bpm): 152   Movement: Present  Presentation: Vertex  General:  Alert, oriented and cooperative. Patient is in no acute distress.  Skin: Skin is warm and dry. No rash noted.   Cardiovascular: Normal heart rate noted  Respiratory: Normal respiratory effort, no problems with respiration noted  Abdomen: Soft, gravid, appropriate for gestational age. Pain/Pressure: Absent     Pelvic:  Cervical exam performed Dilation: 1 Effacement (%): Thick Station: Ballotable  Extremities: Normal range of motion.  Edema: Trace  Mental Status: Normal mood and affect. Normal behavior. Normal judgment and thought content.   Assessment and Plan:  Pregnancy: G1P0 at 1064w1d  1. Supervision of normal first pregnancy, antepartum Patient is doing well without complaints   2. GBS (group B Streptococcus carrier), +RV culture, currently pregnant Will provided prophylaxis in labor   Term labor symptoms and general obstetric precautions including but not limited to vaginal  bleeding, contractions, leaking of fluid and fetal movement were reviewed in detail with the patient. Please refer to After Visit Summary for other counseling recommendations.  No Follow-up on file.   Catalina AntiguaPeggy Shealeigh Dunstan, MD

## 2016-12-04 ENCOUNTER — Telehealth: Payer: Self-pay

## 2016-12-04 NOTE — Telephone Encounter (Signed)
Pt questioned plans for IOL. Informed pt we will not allow her to go past 41 wks since she is low risk. Any earlier IOL is to be determined by the provider. Pt aware we can discuss further during upcoming appt on Thurs. Pt agrees and has no further questions.

## 2016-12-07 ENCOUNTER — Ambulatory Visit (INDEPENDENT_AMBULATORY_CARE_PROVIDER_SITE_OTHER): Payer: Managed Care, Other (non HMO) | Admitting: Obstetrics and Gynecology

## 2016-12-07 ENCOUNTER — Encounter (HOSPITAL_COMMUNITY): Payer: Self-pay | Admitting: *Deleted

## 2016-12-07 ENCOUNTER — Telehealth (HOSPITAL_COMMUNITY): Payer: Self-pay | Admitting: *Deleted

## 2016-12-07 VITALS — BP 134/87 | HR 107 | Temp 99.1°F | Wt 247.2 lb

## 2016-12-07 DIAGNOSIS — O0930 Supervision of pregnancy with insufficient antenatal care, unspecified trimester: Secondary | ICD-10-CM

## 2016-12-07 DIAGNOSIS — O283 Abnormal ultrasonic finding on antenatal screening of mother: Secondary | ICD-10-CM

## 2016-12-07 DIAGNOSIS — O0933 Supervision of pregnancy with insufficient antenatal care, third trimester: Secondary | ICD-10-CM

## 2016-12-07 DIAGNOSIS — Z3400 Encounter for supervision of normal first pregnancy, unspecified trimester: Secondary | ICD-10-CM

## 2016-12-07 DIAGNOSIS — O99820 Streptococcus B carrier state complicating pregnancy: Secondary | ICD-10-CM

## 2016-12-07 NOTE — Telephone Encounter (Signed)
Preadmission screen  

## 2016-12-07 NOTE — Progress Notes (Signed)
   PRENATAL VISIT NOTE  Subjective:  Cristina [name] is a 21 y.o. G1P0 at 6672w4d being seen today for ongoing prenatal care.  She is currently monitored for the following issues for this low-risk pregnancy and has Supervision of normal first pregnancy, antepartum; Late prenatal care starting at [redacted] weeks GA; Abnormal fetal ultrasound; Positive GBS test; and GBS (group B Streptococcus carrier), +RV culture, currently pregnant on her problem list.  Patient reports no complaints.  Contractions: Irregular. Vag. Bleeding: None.  Movement: Present. Denies leaking of fluid.   The following portions of the patient's history were reviewed and updated as appropriate: allergies, current medications, past family history, past medical history, past social history, past surgical history and problem list. Problem list updated.  Objective:   Vitals:   12/07/16 0928  BP: 134/87  Pulse: (!) 107  Temp: 99.1 F (37.3 C)  Weight: 247 lb 3.2 oz (112.1 kg)    Fetal Status: Fetal Heart Rate (bpm): 145 Fundal Height: 40 cm Movement: Present  Presentation: Vertex  General:  Alert, oriented and cooperative. Patient is in no acute distress.  Skin: Skin is warm and dry. No rash noted.   Cardiovascular: Normal heart rate noted  Respiratory: Normal respiratory effort, no problems with respiration noted  Abdomen: Soft, gravid, appropriate for gestational age. Pain/Pressure: Present     Pelvic:  Cervical exam performed Dilation: 1 Effacement (%): Thick Station: Ballotable  Extremities: Normal range of motion.  Edema: Trace  Mental Status: Normal mood and affect. Normal behavior. Normal judgment and thought content.   Assessment and Plan:  Pregnancy: G1P0 at 3972w4d  1. Supervision of normal first pregnancy, antepartum Patient is doing well without complaints Post date fetal testing next visit IOL at 41 weeks  2. Abnormal fetal ultrasound Echogenic bowel with neg TORCH  3. GBS (group B Streptococcus carrier),  +RV culture, currently pregnant Will provide prophylaxis in labor  4. Late prenatal care starting at [redacted] weeks GA   Term labor symptoms and general obstetric precautions including but not limited to vaginal bleeding, contractions, leaking of fluid and fetal movement were reviewed in detail with the patient. Please refer to After Visit Summary for other counseling recommendations.  Return in about 1 week (around 12/14/2016) for ROB with NST.   Catalina AntiguaPeggy Haydyn Girvan, MD

## 2016-12-07 NOTE — Progress Notes (Signed)
Patient reports good fetal  movement, and contractions that come and go. 

## 2016-12-12 ENCOUNTER — Encounter (HOSPITAL_COMMUNITY): Payer: Self-pay

## 2016-12-12 ENCOUNTER — Inpatient Hospital Stay (EMERGENCY_DEPARTMENT_HOSPITAL)
Admission: AD | Admit: 2016-12-12 | Discharge: 2016-12-12 | Disposition: A | Discharge status: Home / Self Care | Payer: Managed Care, Other (non HMO) | Source: Ambulatory Visit | Attending: Obstetrics and Gynecology | Admitting: Obstetrics and Gynecology

## 2016-12-12 DIAGNOSIS — O99820 Streptococcus B carrier state complicating pregnancy: Secondary | ICD-10-CM | POA: Diagnosis not present

## 2016-12-12 DIAGNOSIS — O26893 Other specified pregnancy related conditions, third trimester: Secondary | ICD-10-CM

## 2016-12-12 DIAGNOSIS — O0930 Supervision of pregnancy with insufficient antenatal care, unspecified trimester: Secondary | ICD-10-CM

## 2016-12-12 DIAGNOSIS — O10013 Pre-existing essential hypertension complicating pregnancy, third trimester: Secondary | ICD-10-CM

## 2016-12-12 DIAGNOSIS — O133 Gestational [pregnancy-induced] hypertension without significant proteinuria, third trimester: Secondary | ICD-10-CM | POA: Diagnosis not present

## 2016-12-12 DIAGNOSIS — Z3A40 40 weeks gestation of pregnancy: Secondary | ICD-10-CM

## 2016-12-12 DIAGNOSIS — R0989 Other specified symptoms and signs involving the circulatory and respiratory systems: Secondary | ICD-10-CM

## 2016-12-12 DIAGNOSIS — O0933 Supervision of pregnancy with insufficient antenatal care, third trimester: Secondary | ICD-10-CM

## 2016-12-12 DIAGNOSIS — N898 Other specified noninflammatory disorders of vagina: Secondary | ICD-10-CM

## 2016-12-12 LAB — URINALYSIS, ROUTINE W REFLEX MICROSCOPIC
BILIRUBIN URINE: NEGATIVE
Glucose, UA: NEGATIVE mg/dL
Hgb urine dipstick: NEGATIVE
Ketones, ur: NEGATIVE mg/dL
NITRITE: NEGATIVE
Protein, ur: NEGATIVE mg/dL
SPECIFIC GRAVITY, URINE: 1.009 (ref 1.005–1.030)
pH: 6 (ref 5.0–8.0)

## 2016-12-12 LAB — COMPREHENSIVE METABOLIC PANEL
ALBUMIN: 2.7 g/dL — AB (ref 3.5–5.0)
ALT: 9 U/L — AB (ref 14–54)
AST: 17 U/L (ref 15–41)
Alkaline Phosphatase: 219 U/L — ABNORMAL HIGH (ref 38–126)
Anion gap: 9 (ref 5–15)
BUN: 6 mg/dL (ref 6–20)
CHLORIDE: 103 mmol/L (ref 101–111)
CO2: 21 mmol/L — AB (ref 22–32)
CREATININE: 0.61 mg/dL (ref 0.44–1.00)
Calcium: 8.4 mg/dL — ABNORMAL LOW (ref 8.9–10.3)
GFR calc Af Amer: >60 mL/min (ref >60)
GLUCOSE: 90 mg/dL (ref 65–99)
POTASSIUM: 3.6 mmol/L (ref 3.5–5.1)
SODIUM: 133 mmol/L — AB (ref 135–145)
Total Bilirubin: 0.5 mg/dL (ref 0.3–1.2)
Total Protein: 7.2 g/dL (ref 6.5–8.1)

## 2016-12-12 LAB — CBC
HEMATOCRIT: 30 % — AB (ref 36.0–46.0)
Hemoglobin: 9.9 g/dL — ABNORMAL LOW (ref 12.0–15.0)
MCH: 25.4 pg — AB (ref 26.0–34.0)
MCHC: 33 g/dL (ref 30.0–36.0)
MCV: 76.9 fL — AB (ref 78.0–100.0)
PLATELETS: 173 10*3/uL (ref 150–400)
RBC: 3.9 MIL/uL (ref 3.87–5.11)
RDW: 15.5 % (ref 11.5–15.5)
WBC: 14 10*3/uL — AB (ref 4.0–10.5)

## 2016-12-12 LAB — AMNISURE RUPTURE OF MEMBRANE (ROM) NOT AT ARMC: AMNISURE: NEGATIVE

## 2016-12-12 LAB — PROTEIN / CREATININE RATIO, URINE
Creatinine, Urine: 72 mg/dL
PROTEIN CREATININE RATIO: 0.11 mg/mg{creat} (ref 0.00–0.15)
Total Protein, Urine: 8 mg/dL

## 2016-12-12 NOTE — MAU Note (Signed)
?   Gushes over the course of the last couple wks, never said anything to dr's.  Small gushes, panties sometimes wet.  Denies pain or bleeding.

## 2016-12-12 NOTE — MAU Provider Note (Signed)
Chief Complaint:  possible rom   First Provider Initiated Contact with Patient 12/12/16 1036     HPI: Cristina [name] is a 21 y.o. G1P0 at 740w2dwho presents to maternity admissions reporting small gushes of fluid.  Denies pain. She reports good fetal movement, denies vaginal bleeding, vaginal itching/burning, urinary symptoms, h/a, dizziness, n/v, diarrhea, constipation or fever/chills.  She denies headache, visual changes or RUQ abdominal pain.  Vaginal Discharge  The patient's primary symptoms include vaginal discharge. The patient's pertinent negatives include no genital itching, genital lesions, genital odor, genital rash, pelvic pain or vaginal bleeding. This is a new problem. The current episode started in the past 7 days. The problem occurs intermittently. The patient is experiencing no pain. She is pregnant. Pertinent negatives include no abdominal pain, back pain, chills, constipation, diarrhea, dysuria, fever, hematuria, nausea or vomiting. The vaginal discharge was clear and watery. There has been no bleeding. She has not been passing clots. She has not been passing tissue. Nothing aggravates the symptoms. She has tried nothing for the symptoms.   RN Note: ? Gushes over the course of the last couple wks, never said anything to dr's.  Small gushes, panties sometimes wet.  Denies pain or bleeding.    Past Medical History: Past Medical History:  Diagnosis Date  . Chlamydia infection 08/2015   IUD - Mirena removed 12/2015  . Gonorrhea 08/2015   IUD Mirena removed 12/2015    Past obstetric history: OB History  Gravida Para Term Preterm AB Living  1            SAB TAB Ectopic Multiple Live Births               # Outcome Date GA Lbr Len/2nd Weight Sex Delivery Anes PTL Lv  1 Current               Past Surgical History: Past Surgical History:  Procedure Laterality Date  . NO PAST SURGERIES      Family History: Family History  Problem Relation Age of Onset  . Diabetes  Maternal Grandmother   . Diabetes Maternal Grandfather   . Allergies Neg Hx   . Cancer Neg Hx   . Hyperlipidemia Neg Hx   . Heart disease Neg Hx   . Mental illness Neg Hx   . Mental retardation Neg Hx   . Obesity Neg Hx   . Seizures Neg Hx   . Epilepsy Neg Hx     Social History: Social History  Substance Use Topics  . Smoking status: Never Smoker  . Smokeless tobacco: Never Used  . Alcohol use No    Allergies: No Known Allergies  Meds:  Prescriptions Prior to Admission  Medication Sig Dispense Refill Last Dose  . Prenatal MV & Min w/FA-DHA (PRENATAL ADULT GUMMY/DHA/FA) 0.4-25 MG CHEW Chew 2 each by mouth daily.   12/11/2016 at Unknown time    I have reviewed patient's Past Medical Hx, Surgical Hx, Family Hx, Social Hx, medications and allergies.   ROS:  Review of Systems  Constitutional: Negative for chills and fever.  Gastrointestinal: Negative for abdominal pain, constipation, diarrhea, nausea and vomiting.  Genitourinary: Positive for vaginal discharge. Negative for dysuria, hematuria and pelvic pain.  Musculoskeletal: Negative for back pain.   Other systems negative  Physical Exam  Patient Vitals for the past 24 hrs:  BP Temp Temp src Pulse Resp Weight  12/12/16 1316 125/71 - - 84 - -  12/12/16 1300 116/71 - - 84 - -  12/12/16 1245 121/65 - - 88 - -  12/12/16 1230 121/70 - - 84 - -  12/12/16 1215 127/68 - - 90 - -  12/12/16 1203 130/88 - - 96 - -  12/12/16 1132 147/93 - - 95 - -  12/12/16 1115 143/86 - - 91 - -  12/12/16 1100 141/86 - - 82 - -  12/12/16 1046 146/85 - - 89 - -  12/12/16 1030 120/73 - - 79 - -  12/12/16 1015 122/82 - - 99 - -  12/12/16 1000 131/90 - - 89 - -  12/12/16 0945 124/91 - - 92 - -  12/12/16 0931 113/70 - - 100 - -  12/12/16 0920 135/88 - - 97 - -  12/12/16 0843 139/92 99 F (37.2 C) Oral 90 16 250 lb 4 oz (113.5 kg)   Constitutional: Well-developed, well-nourished female in no acute distress.  Cardiovascular: normal rate and  rhythm Respiratory: normal effort, clear to auscultation bilaterally GI: Abd soft, non-tender, gravid appropriate for gestational age.   No rebound or guarding. MS: Extremities nontender, no edema, normal ROM Neurologic: Alert and oriented x 4.  GU: Neg CVAT.  PELVIC EXAM: Cervix pink, visually closed, without lesion, scant white creamy discharge, vaginal walls and external genitalia normal Bimanual exam: Cervix firm, posterior, neg CMT, uterus nontender, Fundal Height consistent with dates, adnexa without tenderness, enlargement, or mass    Amnisure done.  FHT:  Baseline 140 , moderate variability, accelerations present, no decelerations Contractions: Irregular     Labs: Results for orders placed or performed during the hospital encounter of 12/12/16 (from the past 24 hour(s))  Urinalysis, Routine w reflex microscopic     Status: Abnormal   Collection Time: 12/12/16  8:45 AM  Result Value Ref Range   Color, Urine YELLOW YELLOW   APPearance CLEAR CLEAR   Specific Gravity, Urine 1.009 1.005 - 1.030   pH 6.0 5.0 - 8.0   Glucose, UA NEGATIVE NEGATIVE mg/dL   Hgb urine dipstick NEGATIVE NEGATIVE   Bilirubin Urine NEGATIVE NEGATIVE   Ketones, ur NEGATIVE NEGATIVE mg/dL   Protein, ur NEGATIVE NEGATIVE mg/dL   Nitrite NEGATIVE NEGATIVE   Leukocytes, UA TRACE (A) NEGATIVE   RBC / HPF 0-5 0 - 5 RBC/hpf   WBC, UA 0-5 0 - 5 WBC/hpf   Bacteria, UA RARE (A) NONE SEEN   Squamous Epithelial / LPF 0-5 (A) NONE SEEN   Mucous PRESENT   Protein / creatinine ratio, urine     Status: None   Collection Time: 12/12/16  8:45 AM  Result Value Ref Range   Creatinine, Urine 72.00 mg/dL   Total Protein, Urine 8 mg/dL   Protein Creatinine Ratio 0.11 0.00 - 0.15 mg/mg[Cre]  Amnisure rupture of membrane (rom)not at [facility]     Status: None   Collection Time: 12/12/16  9:04 AM  Result Value Ref Range   Amnisure ROM NEGATIVE   Comprehensive metabolic panel     Status: Abnormal   Collection Time: 12/12/16  10:37 AM  Result Value Ref Range   Sodium 133 (L) 135 - 145 mmol/L   Potassium 3.6 3.5 - 5.1 mmol/L   Chloride 103 101 - 111 mmol/L   CO2 21 (L) 22 - 32 mmol/L   Glucose, Bld 90 65 - 99 mg/dL   BUN 6 6 - 20 mg/dL   Creatinine, Ser 0.98 0.44 - 1.00 mg/dL   Calcium 8.4 (L) 8.9 - 10.3 mg/dL   Total Protein 7.2 6.5 - 8.1 g/dL  Albumin 2.7 (L) 3.5 - 5.0 g/dL   AST 17 15 - 41 U/L   ALT 9 (L) 14 - 54 U/L   Alkaline Phosphatase 219 (H) 38 - 126 U/L   Total Bilirubin 0.5 0.3 - 1.2 mg/dL   GFR calc non Af Amer >60 >60 mL/min   GFR calc Af Amer >60 >60 mL/min   Anion gap 9 5 - 15  CBC     Status: Abnormal   Collection Time: 12/12/16 10:37 AM  Result Value Ref Range   WBC 14.0 (H) 4.0 - 10.5 K/uL   RBC 3.90 3.87 - 5.11 MIL/uL   Hemoglobin 9.9 (L) 12.0 - 15.0 g/dL   HCT 16.1 (L) 09.6 - 04.5 %   MCV 76.9 (L) 78.0 - 100.0 fL   MCH 25.4 (L) 26.0 - 34.0 pg   MCHC 33.0 30.0 - 36.0 g/dL   RDW 40.9 81.1 - 91.4 %   Platelets 173 150 - 400 K/uL   O/POS/-- (08/01 1029)  Imaging:  No results found.  MAU Course/MDM: I have ordered labs and reviewed results. PIH labs ordered due to some elevations in BPs Since BP elevations are new, patient was observed for 4 hours.  The entire 4th hour, the BPs were normal. So did not have elevations 4 hrs apart. Consulted Dr Constant before and after. Since they were not elevated last hour, will discharge home and have her get BP rechecked in office tomorrow at 0930hrs.  If she develops headache or other severe symptoms, return here. If BP elevated tomorrow, induce. Otherwise keep appt for induction this weekend NST reviewed and found to be reassuring    Assessment: 1. Labile hypertension   2. GBS (group B Streptococcus carrier), +RV culture, currently pregnant   3. Late prenatal care affecting pregnancy, antepartum   4. Vaginal discharge during pregnancy in third trimester     Plan: Discharge home Labor precautions and fetal kick counts Follow up in  Office for prenatal visits and recheck of BP tomorrow morning at 0930  Follow-up Information    HARPER,CHARLES A, MD. Go in 1 day(s).   Specialty:  Obstetrics and Gynecology Why:  0930 am  Contact information: [address] [place] [address] [phone]          Encouraged to return here or to other Urgent Care/ED if she develops worsening of symptoms, increase in pain, fever, or other concerning symptoms.   Pt stable at time of discharge.  Wynelle Bourgeois CNM, MSN Certified Nurse-Midwife 12/12/2016 1:21 PM

## 2016-12-12 NOTE — Discharge Instructions (Signed)
Hypertension During Pregnancy °Hypertension, commonly called high blood pressure, is when the force of blood pumping through your arteries is too strong. Arteries are blood vessels that carry blood from the heart throughout the body. Hypertension during pregnancy can cause problems for you and your baby. Your baby may be born early (prematurely) or may not weigh as much as he or she should at birth. Very bad cases of hypertension during pregnancy can be life-threatening. °Different types of hypertension can occur during pregnancy. These include: °· Chronic hypertension. This happens when: °¨ You have hypertension before pregnancy and it continues during pregnancy. °¨ You develop hypertension before you are [redacted] weeks pregnant, and it continues during pregnancy. °· Gestational hypertension. This is hypertension that develops after the 20th week of pregnancy. °· Preeclampsia, also called toxemia of pregnancy. This is a very serious type of hypertension that develops only during pregnancy. It affects the whole body, and it can be very dangerous for you and your baby. °Gestational hypertension and preeclampsia usually go away within 6 weeks after your baby is born. Women who have hypertension during pregnancy have a greater chance of developing hypertension later in life or during future pregnancies. °What are the causes? °The exact cause of hypertension is not known. °What increases the risk? °There are certain factors that make it more likely for you to develop hypertension during pregnancy. These include: °· Having hypertension during a previous pregnancy or prior to pregnancy. °· Being overweight. °· Being older than age 40. °· Being pregnant for the first time or being pregnant with more than one baby. °· Becoming pregnant using fertilization methods such as IVF (in vitro fertilization). °· Having diabetes, kidney problems, or systemic lupus erythematosus. °· Having a family history of hypertension. °What are the  signs or symptoms? °Chronic hypertension and gestational hypertension rarely cause symptoms. Preeclampsia causes symptoms, which may include: °· Increased protein in your urine. Your health care provider will check for this at every visit before you give birth (prenatal visit). °· Severe headaches. °· Sudden weight gain. °· Swelling of the hands, face, legs, and feet. °· Nausea and vomiting. °· Vision problems, such as blurred or double vision. °· Numbness in the face, arms, legs, and feet. °· Dizziness. °· Slurred speech. °· Sensitivity to bright lights. °· Abdominal pain. °· Convulsions. °How is this diagnosed? °You may be diagnosed with hypertension during a routine prenatal exam. At each prenatal visit, you may: °· Have a urine test to check for high amounts of protein in your urine. °· Have your blood pressure checked. A blood pressure reading is recorded as two numbers, such as "120 over 80" (or 120/80). The first ("top") number is called the systolic pressure. It is a measure of the pressure in your arteries when your heart beats. The second ("bottom") number is called the diastolic pressure. It is a measure of the pressure in your arteries as your heart relaxes between beats. Blood pressure is measured in a unit called mm Hg. A normal blood pressure reading is: °¨ Systolic: below 120. °¨ Diastolic: below 80. °The type of hypertension that you are diagnosed with depends on your test results and when your symptoms developed. °· Chronic hypertension is usually diagnosed before 20 weeks of pregnancy. °· Gestational hypertension is usually diagnosed after 20 weeks of pregnancy. °· Hypertension with high amounts of protein in the urine is diagnosed as preeclampsia. °· Blood pressure measurements that stay above 160 systolic, or above 110 diastolic, are signs of severe preeclampsia. °  How is this treated? °Treatment for hypertension during pregnancy varies depending on the type of hypertension you have and how  serious it is. °· If you take medicines called ACE inhibitors to treat chronic hypertension, you may need to switch medicines. ACE inhibitors should not be taken during pregnancy. °· If you have gestational hypertension, you may need to take blood pressure medicine. °· If you are at risk for preeclampsia, your health care provider may recommend that you take a low-dose aspirin every day to prevent high blood pressure during your pregnancy. °· If you have severe preeclampsia, you may need to be hospitalized so you and your baby can be monitored closely. You may also need to take medicine (magnesium sulfate) to prevent seizures and to lower blood pressure. This medicine may be given as an injection or through an IV tube. °· In some cases, if your condition gets worse, you may need to deliver your baby early. °Follow these instructions at home: °Eating and drinking °· Drink enough fluid to keep your urine clear or pale yellow. °· Eat a healthy diet that is low in salt (sodium). Do not add salt to your food. Check food labels to see how much sodium a food or beverage contains. °Lifestyle °· Do not use any products that contain nicotine or tobacco, such as cigarettes and e-cigarettes. If you need help quitting, ask your health care provider. °· Do not use alcohol. °· Avoid caffeine. °· Avoid stress as much as possible. Rest and get plenty of sleep. °General instructions °· Take over-the-counter and prescription medicines only as told by your health care provider. °· While lying down, lie on your left side. This keeps pressure off your baby. °· While sitting or lying down, raise (elevate) your feet. Try putting some pillows under your lower legs. °· Exercise regularly. Ask your health care provider what kinds of exercise are best for you. °· Keep all prenatal and follow-up visits as told by your health care provider. This is important. °Contact a health care provider if: °· You have symptoms that your health care provider  told you may require more treatment or monitoring, such as: °¨ Fever. °¨ Vomiting. °¨ Headache. °Get help right away if: °· You have severe abdominal pain or vomiting that does not get better with treatment. °· You suddenly develop swelling in your hands, ankles, or face. °· You gain 4 lbs (1.8 kg) or more in 1 week. °· You develop vaginal bleeding, or you have blood in your urine. °· You do not feel your baby moving as much as usual. °· You have blurred or double vision. °· You have muscle twitching or sudden tightening (spasms). °· You have shortness of breath. °· Your lips or fingernails turn blue. °This information is not intended to replace advice given to you by your health care provider. Make sure you discuss any questions you have with your health care provider. °Document Released: 07/04/2011 Document Revised: 05/05/2016 Document Reviewed: 03/31/2016 °Elsevier Interactive Patient Education © 2017 [employer] ° °

## 2016-12-13 ENCOUNTER — Encounter (HOSPITAL_COMMUNITY): Payer: Self-pay | Admitting: *Deleted

## 2016-12-13 ENCOUNTER — Inpatient Hospital Stay (HOSPITAL_COMMUNITY)
Admission: AD | Admit: 2016-12-13 | Discharge: 2016-12-19 | DRG: 765 | Disposition: A | Discharge status: Home / Self Care | Payer: Managed Care, Other (non HMO) | Source: Ambulatory Visit | Attending: Obstetrics & Gynecology | Admitting: Obstetrics & Gynecology

## 2016-12-13 ENCOUNTER — Ambulatory Visit: Payer: Managed Care, Other (non HMO)

## 2016-12-13 VITALS — BP 147/92

## 2016-12-13 DIAGNOSIS — O411230 Chorioamnionitis, third trimester, not applicable or unspecified: Secondary | ICD-10-CM | POA: Diagnosis present

## 2016-12-13 DIAGNOSIS — Z3A40 40 weeks gestation of pregnancy: Secondary | ICD-10-CM

## 2016-12-13 DIAGNOSIS — O163 Unspecified maternal hypertension, third trimester: Secondary | ICD-10-CM

## 2016-12-13 DIAGNOSIS — Z833 Family history of diabetes mellitus: Secondary | ICD-10-CM | POA: Diagnosis not present

## 2016-12-13 DIAGNOSIS — K219 Gastro-esophageal reflux disease without esophagitis: Secondary | ICD-10-CM | POA: Diagnosis present

## 2016-12-13 DIAGNOSIS — O99824 Streptococcus B carrier state complicating childbirth: Secondary | ICD-10-CM | POA: Diagnosis present

## 2016-12-13 DIAGNOSIS — O99820 Streptococcus B carrier state complicating pregnancy: Secondary | ICD-10-CM | POA: Diagnosis not present

## 2016-12-13 DIAGNOSIS — O9081 Anemia of the puerperium: Secondary | ICD-10-CM | POA: Diagnosis not present

## 2016-12-13 DIAGNOSIS — D649 Anemia, unspecified: Secondary | ICD-10-CM | POA: Diagnosis not present

## 2016-12-13 DIAGNOSIS — O324XX0 Maternal care for high head at term, not applicable or unspecified: Secondary | ICD-10-CM | POA: Diagnosis present

## 2016-12-13 DIAGNOSIS — O3663X0 Maternal care for excessive fetal growth, third trimester, not applicable or unspecified: Secondary | ICD-10-CM | POA: Diagnosis present

## 2016-12-13 DIAGNOSIS — O9962 Diseases of the digestive system complicating childbirth: Secondary | ICD-10-CM | POA: Diagnosis present

## 2016-12-13 DIAGNOSIS — R030 Elevated blood-pressure reading, without diagnosis of hypertension: Secondary | ICD-10-CM | POA: Diagnosis present

## 2016-12-13 DIAGNOSIS — O133 Gestational [pregnancy-induced] hypertension without significant proteinuria, third trimester: Secondary | ICD-10-CM

## 2016-12-13 DIAGNOSIS — O135 Gestational [pregnancy-induced] hypertension without significant proteinuria, complicating the puerperium: Secondary | ICD-10-CM | POA: Diagnosis not present

## 2016-12-13 DIAGNOSIS — O894 Spinal and epidural anesthesia-induced headache during the puerperium: Secondary | ICD-10-CM | POA: Diagnosis not present

## 2016-12-13 DIAGNOSIS — O0930 Supervision of pregnancy with insufficient antenatal care, unspecified trimester: Secondary | ICD-10-CM

## 2016-12-13 DIAGNOSIS — O134 Gestational [pregnancy-induced] hypertension without significant proteinuria, complicating childbirth: Principal | ICD-10-CM | POA: Diagnosis present

## 2016-12-13 DIAGNOSIS — O99214 Obesity complicating childbirth: Secondary | ICD-10-CM | POA: Diagnosis present

## 2016-12-13 DIAGNOSIS — Z3403 Encounter for supervision of normal first pregnancy, third trimester: Secondary | ICD-10-CM

## 2016-12-13 HISTORY — DX: Other specified health status: Z78.9

## 2016-12-13 LAB — URINALYSIS, ROUTINE W REFLEX MICROSCOPIC
BILIRUBIN URINE: NEGATIVE
Glucose, UA: NEGATIVE mg/dL
HGB URINE DIPSTICK: NEGATIVE
Ketones, ur: NEGATIVE mg/dL
Leukocytes, UA: NEGATIVE
NITRITE: NEGATIVE
PH: 6 (ref 5.0–8.0)
Protein, ur: NEGATIVE mg/dL
SPECIFIC GRAVITY, URINE: 1.013 (ref 1.005–1.030)

## 2016-12-13 LAB — TYPE AND SCREEN
ABO/RH(D): O POS
Antibody Screen: NEGATIVE

## 2016-12-13 LAB — CBC
HCT: 31.9 % — ABNORMAL LOW (ref 36.0–46.0)
Hemoglobin: 10.4 g/dL — ABNORMAL LOW (ref 12.0–15.0)
MCH: 25.2 pg — AB (ref 26.0–34.0)
MCHC: 32.6 g/dL (ref 30.0–36.0)
MCV: 77.4 fL — AB (ref 78.0–100.0)
PLATELETS: 200 10*3/uL (ref 150–400)
RBC: 4.12 MIL/uL (ref 3.87–5.11)
RDW: 15.6 % — ABNORMAL HIGH (ref 11.5–15.5)
WBC: 14.7 10*3/uL — ABNORMAL HIGH (ref 4.0–10.5)

## 2016-12-13 LAB — COMPREHENSIVE METABOLIC PANEL
ALBUMIN: 2.9 g/dL — AB (ref 3.5–5.0)
ALK PHOS: 233 U/L — AB (ref 38–126)
ALT: 9 U/L — AB (ref 14–54)
AST: 19 U/L (ref 15–41)
Anion gap: 8 (ref 5–15)
BILIRUBIN TOTAL: 0.5 mg/dL (ref 0.3–1.2)
BUN: 7 mg/dL (ref 6–20)
CALCIUM: 8.9 mg/dL (ref 8.9–10.3)
CO2: 22 mmol/L (ref 22–32)
CREATININE: 0.64 mg/dL (ref 0.44–1.00)
Chloride: 103 mmol/L (ref 101–111)
GFR calc non Af Amer: >60 mL/min (ref >60)
GLUCOSE: 118 mg/dL — AB (ref 65–99)
Potassium: 4.6 mmol/L (ref 3.5–5.1)
SODIUM: 133 mmol/L — AB (ref 135–145)
Total Protein: 7 g/dL (ref 6.5–8.1)

## 2016-12-13 MED ORDER — OXYTOCIN BOLUS FROM INFUSION: 500.0000 mL | Freq: Once | INTRAVENOUS | Status: DC

## 2016-12-13 MED ORDER — DIPHENHYDRAMINE HCL 50 MG/ML IJ SOLN: 12.5000 mg | INTRAMUSCULAR | Status: DC | PRN

## 2016-12-13 MED ORDER — EPHEDRINE 5 MG/ML INJ: 10.0000 mg | INTRAVENOUS | Status: DC | PRN

## 2016-12-13 MED ORDER — LACTATED RINGERS IV SOLN
INTRAVENOUS | Status: DC
  Administered 2016-12-13: 22:00:00 via INTRAVENOUS
  Administered 2016-12-13: 125 mL via INTRAVENOUS
  Administered 2016-12-14 – 2016-12-15 (×5): via INTRAVENOUS

## 2016-12-13 MED ORDER — OXYCODONE-ACETAMINOPHEN 5-325 MG PO TABS: 1.0000 | ORAL_TABLET | ORAL | Status: DC | PRN

## 2016-12-13 MED ORDER — FENTANYL 2.5 MCG/ML BUPIVACAINE 1/10 % EPIDURAL INFUSION (WH - ANES)
14.0000 mL/h | INTRAMUSCULAR | Status: DC | PRN
  Administered 2016-12-14 (×2): 14 mL/h via EPIDURAL
  Filled 2016-12-13 (×2): qty 100

## 2016-12-13 MED ORDER — ONDANSETRON HCL 4 MG/2ML IJ SOLN: 4.0000 mg | Freq: Four times a day (QID) | INTRAMUSCULAR | Status: DC | PRN

## 2016-12-13 MED ORDER — TERBUTALINE SULFATE 1 MG/ML IJ SOLN: 0.2500 mg | Freq: Once | INTRAMUSCULAR | Status: DC | PRN

## 2016-12-13 MED ORDER — LACTATED RINGERS IV SOLN
500.0000 mL | Freq: Once | INTRAVENOUS | Status: AC
  Administered 2016-12-14: 1000 mL via INTRAVENOUS

## 2016-12-13 MED ORDER — PHENYLEPHRINE 40 MCG/ML (10ML) SYRINGE FOR IV PUSH (FOR BLOOD PRESSURE SUPPORT): 80.0000 ug | PREFILLED_SYRINGE | INTRAVENOUS | Status: DC | PRN

## 2016-12-13 MED ORDER — EPHEDRINE 5 MG/ML INJ
10.0000 mg | INTRAVENOUS | Status: DC | PRN
Start: 2016-12-13 — End: 2016-12-15

## 2016-12-13 MED ORDER — LIDOCAINE HCL (PF) 1 % IJ SOLN
30.0000 mL | INTRAMUSCULAR | Status: DC | PRN
  Filled 2016-12-13: qty 30

## 2016-12-13 MED ORDER — OXYCODONE-ACETAMINOPHEN 5-325 MG PO TABS: 2.0000 | ORAL_TABLET | ORAL | Status: DC | PRN

## 2016-12-13 MED ORDER — MISOPROSTOL 25 MCG QUARTER TABLET
25.0000 ug | ORAL_TABLET | ORAL | Status: DC | PRN
  Administered 2016-12-13: 25 ug via VAGINAL
  Filled 2016-12-13: qty 0.25

## 2016-12-13 MED ORDER — MISOPROSTOL 50MCG HALF TABLET
50.0000 ug | ORAL_TABLET | ORAL | Status: DC | PRN
  Administered 2016-12-13: 50 ug via ORAL
  Filled 2016-12-13: qty 0.5

## 2016-12-13 MED ORDER — LACTATED RINGERS IV SOLN
500.0000 mL | INTRAVENOUS | Status: DC | PRN
  Administered 2016-12-14 (×2): 1000 mL via INTRAVENOUS

## 2016-12-13 MED ORDER — ZOLPIDEM TARTRATE 5 MG PO TABS
5.0000 mg | ORAL_TABLET | Freq: Every evening | ORAL | Status: DC | PRN
  Administered 2016-12-13: 5 mg via ORAL
  Filled 2016-12-13: qty 1

## 2016-12-13 MED ORDER — PENICILLIN G POT IN DEXTROSE 60000 UNIT/ML IV SOLN
3.0000 10*6.[IU] | INTRAVENOUS | Status: DC
  Administered 2016-12-13 – 2016-12-14 (×6): 3 10*6.[IU] via INTRAVENOUS
  Filled 2016-12-13 (×10): qty 50

## 2016-12-13 MED ORDER — ACETAMINOPHEN 325 MG PO TABS
650.0000 mg | ORAL_TABLET | ORAL | Status: DC | PRN
  Administered 2016-12-13 – 2016-12-14 (×2): 650 mg via ORAL
  Filled 2016-12-13 (×2): qty 2

## 2016-12-13 MED ORDER — DEXTROSE 5 % IV SOLN
5.0000 10*6.[IU] | Freq: Once | INTRAVENOUS | Status: AC
  Administered 2016-12-13: 5 10*6.[IU] via INTRAVENOUS
  Filled 2016-12-13: qty 5

## 2016-12-13 MED ORDER — PHENYLEPHRINE 40 MCG/ML (10ML) SYRINGE FOR IV PUSH (FOR BLOOD PRESSURE SUPPORT)
80.0000 ug | PREFILLED_SYRINGE | INTRAVENOUS | Status: DC | PRN
  Filled 2016-12-13: qty 10

## 2016-12-13 MED ORDER — SOD CITRATE-CITRIC ACID 500-334 MG/5ML PO SOLN
30.0000 mL | ORAL | Status: DC | PRN
  Administered 2016-12-15: 30 mL via ORAL
  Filled 2016-12-13: qty 15

## 2016-12-13 MED ORDER — OXYTOCIN 40 UNITS IN LACTATED RINGERS INFUSION - SIMPLE MED
2.5000 [IU]/h | INTRAVENOUS | Status: DC
  Filled 2016-12-13: qty 1000

## 2016-12-13 NOTE — H&P (Signed)
Cristina [name] is a 21 y.o. female presenting for Hypertension.  Was seen yesterday here and BP was elevated for 3 hours but returned to normal the 4th hour with normal labs, so discharged home.  Returned to office today with elevated BP.  Denies headache or visual changes   OB History    Gravida Para Term Preterm AB Living   1             SAB TAB Ectopic Multiple Live Births                 Past Medical History:  Diagnosis Date  . Chlamydia infection 08/2015   IUD - Mirena removed 12/2015  . Gonorrhea 08/2015   IUD Mirena removed 12/2015  . Medical history non-contributory    Past Surgical History:  Procedure Laterality Date  . NO PAST SURGERIES     Family History: family history includes Diabetes in her maternal grandfather and maternal grandmother. Social History:  reports that she has never smoked. She has never used smokeless tobacco. She reports that she does not drink alcohol or use drugs.     Maternal Diabetes: No Genetic Screening: Declined Maternal Ultrasounds/Referrals: Abnormal:  Findings:   Other: echogenic bowel, echogenic cardiac focus Fetal Ultrasounds or other Referrals:  None Maternal Substance Abuse:  No Significant Maternal Medications:  None Significant Maternal Lab Results:  Lab values include: Group B Strep positive Other Comments:  None except late to care and late onset hypertension in pregnancy  Review of Systems  Constitutional: Negative for chills, fever and malaise/fatigue.  Eyes: Negative for blurred vision and photophobia.  Respiratory: Negative for shortness of breath.   Cardiovascular: Positive for leg swelling (trace).  Gastrointestinal: Negative for abdominal pain, constipation, diarrhea, nausea and vomiting.  Genitourinary: Negative for dysuria.  Musculoskeletal: Negative for back pain and myalgias.  Neurological: Negative for dizziness and focal weakness.   Maternal Medical History:  Reason for admission: Nausea. Hypertension    Contractions: Onset was yesterday.   Frequency: irregular.   Perceived severity is mild.    Fetal activity: Perceived fetal activity is normal.   Last perceived fetal movement was within the past hour.    Prenatal complications: PIH and infection (GBS).   No bleeding, oligohydramnios, placental abnormality, pre-eclampsia or preterm labor.   Prenatal Complications - Diabetes: none.      Blood pressure 142/90, pulse 105, temperature 98.8 F (37.1 C), temperature source Oral, resp. rate 16, height 5\' 5"  (1.651 m), weight 250 lb (113.4 kg), last menstrual period 01/29/2016. Maternal Exam:  Uterine Assessment: Contraction strength is mild.  Contraction frequency is irregular.   Abdomen: Patient reports no abdominal tenderness. Fundal height is 39.   Estimated fetal weight is 7.5.   Fetal presentation: vertex Vertex confirmed by US   Introitus: Normal vulva. Normal vagina.  Vagina is negative for discharge.  Ferning test: not done.  Nitrazine test: not done. Amniotic fluid character: not assessed.  Pelvis: adequate for delivery.   Cervix Loose 1cm/ 70%/ -3/vertex  Cervix: Cervix evaluated by digital exam.     Fetal Exam Fetal Monitor Review: Mode: ultrasound.   Baseline rate: 140.  Variability: moderate (6-25 bpm).   Pattern: accelerations present and no decelerations.    Fetal State Assessment: Category I - tracings are normal.     Physical Exam  Constitutional: She is oriented to person, place, and time. She appears well-developed and well-nourished. No distress.  HENT:  Head: Normocephalic.  Neck: Normal range of motion.  Cardiovascular: Normal rate and regular rhythm.  Exam reveals no gallop and no friction rub.   No murmur heard. Respiratory: Effort normal and breath sounds normal. No respiratory distress. She has no wheezes. She has no rales.  GI: Soft. She exhibits no distension. There is no tenderness. There is no rebound and no guarding.  Genitourinary:  Vagina normal. No vaginal discharge found.  Musculoskeletal: Normal range of motion.  Neurological: She is alert and oriented to person, place, and time.  Skin: Skin is warm and dry.  Psychiatric: She has a normal mood and affect.    Prenatal labs: ABO, Rh: O/POS/-- (08/01 1029) Antibody: NEG (08/01 1029) Rubella: 7.99 (08/01 1029) RPR: Non Reactive (11/16 1600)  HBsAg: NEGATIVE (08/01 1029)  HIV: Non Reactive (11/16 1600)  GBS: Positive (01/09 0000)   Assessment/Plan: SIUP at [redacted]w[redacted]d Hypertension in pregnancy, new onset, 24 hrs apart Normal preeclampsia labs Moderately favorable cervix  Admit to [employer] Routine orders Induction of labor, likely balloon and Pitocin, plan per MD   Wynelle Bourgeois 12/13/2016, 11:05 AM

## 2016-12-13 NOTE — Progress Notes (Signed)
Patient ID: Cristina [name], female   DOB: October 30, 1996, 21 y.o.   MRN: [id number]  Mostly comfortable; some cramping; foley balloon still in place BP 140/84, 125/82, other VSS FHR 140-150, +accels, no decels Ctx q 1-6 mins, irreg Cx deferred  IUP@40 .3wks gHTN Cx unfavorable  Given cytotec 50mcg PO  Cristina [name], Cristina [name] CNM 12/13/2016 10:19 PM

## 2016-12-13 NOTE — Progress Notes (Signed)
Patient seen doing well. No complaints. Foley placed. Plan for cytotec vaginally. Continue expectant management.

## 2016-12-13 NOTE — Progress Notes (Signed)
MAU visit yesterday states pt to present today for repeat BP check. BP today 147/92. After consulting with provider, pt to MAU.

## 2016-12-13 NOTE — Anesthesia Pain Management Evaluation Note (Signed)
  [facility] Pain Management Visit Note  Patient: Cristina [name], 21 y.o., female  "Hello I am a member of the anesthesia team at [facility]. We have an anesthesia team available at all times to provide care throughout the hospital, including epidural management and anesthesia for C-section. I don't know your plan for the delivery whether it a natural birth, water birth, IV sedation, nitrous supplementation, doula or epidural, but we want to meet your pain goals."   1.Was your pain managed to your expectations on prior hospitalizations?   No prior hospitalizations  2.What is your expectation for pain management during this hospitalization?     Epidural  3.How can we help you reach that goal?   Record the patient's initial score and the patient's pain goal.   Pain: 0  Pain Goal: 5 [facility] wants you to be able to say your pain was always managed very well.  Laban EmperorMalinova,Ashiah Karpowicz Hristova 12/13/2016

## 2016-12-13 NOTE — MAU Note (Signed)
Pt presents to MAU stating that she was told to come in for an induction. PT was evaluated for increase in blood pressures yesterday and went to the office and the nurse told her to come here today. Headache, denies blurred vision

## 2016-12-14 ENCOUNTER — Inpatient Hospital Stay (HOSPITAL_COMMUNITY): Payer: Managed Care, Other (non HMO) | Admitting: Anesthesiology

## 2016-12-14 ENCOUNTER — Encounter (HOSPITAL_COMMUNITY): Payer: Self-pay | Admitting: *Deleted

## 2016-12-14 ENCOUNTER — Encounter: Payer: Managed Care, Other (non HMO) | Admitting: Certified Nurse Midwife

## 2016-12-14 LAB — CBC
HCT: 29.3 % — ABNORMAL LOW (ref 36.0–46.0)
HEMOGLOBIN: 9.6 g/dL — AB (ref 12.0–15.0)
MCH: 24.9 pg — AB (ref 26.0–34.0)
MCHC: 32.8 g/dL (ref 30.0–36.0)
MCV: 76.1 fL — AB (ref 78.0–100.0)
Platelets: 178 10*3/uL (ref 150–400)
RBC: 3.85 MIL/uL — AB (ref 3.87–5.11)
RDW: 15.6 % — ABNORMAL HIGH (ref 11.5–15.5)
WBC: 18.4 10*3/uL — AB (ref 4.0–10.5)

## 2016-12-14 LAB — RPR: RPR Ser Ql: NONREACTIVE

## 2016-12-14 MED ORDER — AMPICILLIN SODIUM 2 G IJ SOLR
2.0000 g | Freq: Four times a day (QID) | INTRAMUSCULAR | Status: DC
  Administered 2016-12-14 (×2): 2 g via INTRAVENOUS
  Filled 2016-12-14 (×4): qty 2000

## 2016-12-14 MED ORDER — TERBUTALINE SULFATE 1 MG/ML IJ SOLN: 0.2500 mg | Freq: Once | INTRAMUSCULAR | Status: DC | PRN

## 2016-12-14 MED ORDER — FENTANYL CITRATE (PF) 100 MCG/2ML IJ SOLN
100.0000 ug | INTRAMUSCULAR | Status: DC | PRN
  Administered 2016-12-14 (×2): 100 ug via INTRAVENOUS
  Filled 2016-12-14 (×2): qty 2

## 2016-12-14 MED ORDER — LIDOCAINE HCL (PF) 1 % IJ SOLN
INTRAMUSCULAR | Status: DC | PRN
  Administered 2016-12-14: 5 mL via EPIDURAL
  Administered 2016-12-14: 7 mL via EPIDURAL

## 2016-12-14 MED ORDER — DEXTROSE 5 % IV SOLN
200.0000 mg | Freq: Three times a day (TID) | INTRAVENOUS | Status: DC
  Administered 2016-12-14: 200 mg via INTRAVENOUS
  Filled 2016-12-14 (×3): qty 5

## 2016-12-14 MED ORDER — ACETAMINOPHEN 500 MG PO TABS
1000.0000 mg | ORAL_TABLET | Freq: Once | ORAL | Status: AC
  Administered 2016-12-14: 1000 mg via ORAL
  Filled 2016-12-14: qty 2

## 2016-12-14 MED ORDER — OXYTOCIN 40 UNITS IN LACTATED RINGERS INFUSION - SIMPLE MED
1.0000 m[IU]/min | INTRAVENOUS | Status: DC
  Administered 2016-12-14: 2 m[IU]/min via INTRAVENOUS
  Filled 2016-12-14: qty 1000

## 2016-12-14 NOTE — Progress Notes (Signed)
Patient ID: Cristina [name], female   DOB: 01/06/1996, 21 y.o.   MRN: [id number]  S: Patient seen & examined for progress of labor. Patient feeling uncomfortable with contractions, IV pain meds not working, would like to do an epidural. Denies any headaches, changes in vision, RUQ/epigastric, CP/SOB.   O:  Vitals:   12/14/16 0809 12/14/16 0830 12/14/16 0900 12/14/16 0930  BP: (!) 151/92 (!) 152/79 (!) 147/79 (!) 146/94  Pulse: (!) 105 97 97 (!) 118  Resp: 20 20 20 18   Temp:   98.6 F (37 C)   TempSrc:   Oral   Weight:      Height:        Dilation: 7 Effacement (%): 90 Cervical Position: Anterior Station: -1 Presentation: Vertex Exam by:: Enis SlipperJane Bailey, RN   FHT: 140 bpm, mod var, +accels, no decels TOCO: q2-423min   A/P: Will get epidural Will reassess after epidural and consider AROM. No symptoms of preeclampsia Continue expectant management Anticipate SVD

## 2016-12-14 NOTE — Anesthesia Preprocedure Evaluation (Addendum)
Anesthesia Evaluation  Patient identified by MRN, date of birth, ID band Patient awake    Reviewed: Allergy & Precautions, H&P , NPO status , Patient's Chart, lab work & pertinent test results  Airway Mallampati: II  TM Distance: >3 FB Neck ROM: full    Dental no notable dental hx.    Pulmonary neg pulmonary ROS,    Pulmonary exam normal        Cardiovascular Normal cardiovascular exam     Neuro/Psych negative neurological ROS  negative psych ROS   GI/Hepatic negative GI ROS, Neg liver ROS,   Endo/Other  Morbid obesity  Renal/GU negative Renal ROS     Musculoskeletal   Abdominal (+) + obese,   Peds  Hematology negative hematology ROS (+)   Anesthesia Other Findings   Reproductive/Obstetrics (+) Pregnancy                             Anesthesia Physical Anesthesia Plan  ASA: II  Anesthesia Plan: Epidural   Post-op Pain Management:    Induction:   Airway Management Planned:   Additional Equipment:   Intra-op Plan:   Post-operative Plan:   Informed Consent: I have reviewed the patients History and Physical, chart, labs and discussed the procedure including the risks, benefits and alternatives for the proposed anesthesia with the patient or authorized representative who has indicated his/her understanding and acceptance.     Plan Discussed with:   Anesthesia Plan Comments:        Anesthesia Quick Evaluation

## 2016-12-14 NOTE — Anesthesia Procedure Notes (Signed)
Epidural Patient location during procedure: OB Start time: 12/14/2016 10:25 AM End time: 12/14/2016 10:29 AM  Staffing Anesthesiologist: Leilani AbleHATCHETT, Alantra Popoca Performed: anesthesiologist   Preanesthetic Checklist Completed: patient identified, surgical consent, pre-op evaluation, timeout performed, IV checked, risks and benefits discussed and monitors and equipment checked  Epidural Patient position: sitting Prep: site prepped and draped and DuraPrep Patient monitoring: continuous pulse ox and blood pressure Approach: midline Location: L3-L4 Injection technique: LOR air  Needle:  Needle type: Tuohy  Needle gauge: 17 G Needle length: 9 cm and 9 Needle insertion depth: 6 cm Catheter type: closed end flexible Catheter size: 19 Gauge Catheter at skin depth: 11 cm Test dose: negative and Other  Assessment Sensory level: T9 Events: blood not aspirated, injection not painful, no injection resistance, negative IV test and no paresthesia  Additional Notes Reason for block:procedure for pain

## 2016-12-14 NOTE — Progress Notes (Signed)
Patient ID: Cristina [name], female   DOB: 1996/04/17, [age] y.o.   MRN: [id number]  S: Patient has been pushing for about one hour.  No complaints.   O:  Vitals:   12/14/16 2130 12/14/16 2200 12/14/16 2207 12/14/16 2230  BP: 133/72 (!) 149/94  (!) 147/78  Pulse: (!) 121 (!) 118  (!) 117  Resp: 18 16  18   Temp:   100.2 F (37.9 C)   TempSrc:   Oral   SpO2:      Weight:      Height:       Dilation: 10 Dilation Complete Date: 12/14/16 Dilation Complete Time: 1940 Effacement (%): 100 Cervical Position: Middle Station: +2 Presentation: Vertex Exam by:: Dr. Macon LargeAnyanwu   FHT: 155bpm, mod var, no accels, variable decels TOCO: q2-4 min   A/P: Continue amp/gent for Triple I Continue pushing and active management of second stage of labor Hopeful for SVD    Jaynie CollinsUGONNA  Ahamed Hofland, MD, FACOG Attending Obstetrician & Gynecologist, [facility] Center for [facility], [facility]

## 2016-12-14 NOTE — Progress Notes (Signed)
Pt pushed for 45 minutes (was having some pressure), but wanted to take a break.  Ctx q 2-5 minutes.  FHR 165-170, avg variability, occ mild variable.   Vitals:   12/14/16 2100 12/14/16 2130  BP: (!) 156/85 133/72  Pulse: (!) 109 (!) 121  Resp:  18  Temp: 99.8 F (37.7 C)    Will take a break, labor down, increase pitocin to get ctx closer together before she starts to push again,.

## 2016-12-14 NOTE — Progress Notes (Signed)
Patient ID: Cristina [name], female   DOB: 03/10/1996, 21 y.o.   MRN: [id number]  S: Patient seen & examined for multiple variable decels during contractions and progress of labor. Patient comfortable with epidural. Feels intermittent pressure.     O:  Vitals:   12/14/16 1530 12/14/16 1600 12/14/16 1630 12/14/16 1700  BP: (!) 142/86 (!) 143/94 132/79 125/73  Pulse: (!) 106 (!) 108 (!) 103 (!) 102  Resp: 20 18 20 20   Temp:   99.2 F (37.3 C)   TempSrc:   Oral   SpO2:      Weight:      Height:        Dilation: Lip/rim Effacement (%): 100 Cervical Position: Anterior Station: +1 Presentation: Vertex Exam by:: Cristina SlipperJane Bailey, RN   FHT: 150 bpm, mod var, no accels, variable decels during contractions, about 1 minute, nadir to 90 with return to baseline. TOCO: q525min   A/P: Variables resolved after IVF bolus and positional changes Continue expectant management Anticipate SVD

## 2016-12-14 NOTE — Progress Notes (Signed)
Pharmacy Antibiotic Note  Stormy CardKeyana A Campus is a 21 y.o. female admitted on 12/13/2016 for IOL due to hypertension.  Pharmacy has been consulted for Gentamicin dosing for Triple I.  Plan: 1. Gentamicin 200mg  IV q8h 2. Will continue to follow and assess need for further kinetic workup based on duration of therapy  Height: 5\' 5"  (165.1 cm) Weight: 250 lb (113.4 kg) IBW/kg (Calculated) : 57  Temp (24hrs), Avg:98.9 F (37.2 C), Min:98 F (36.7 C), Max:101.5 F (38.6 C)   Recent Labs Lab 12/12/16 1037 12/13/16 1215 12/14/16 0906  WBC 14.0* 14.7* 18.4*  CREATININE 0.61 0.64  --     Estimated Creatinine Clearance: 142.1 mL/min (by C-G formula based on SCr of 0.64 mg/dL).    No Known Allergies  Antimicrobials this admission: Penicillin for GBS protocol Ampicillin 2 gram IV q6h  2/15 >>  Dose adjustments this admission:   Microbiology results:   Thank you for allowing pharmacy to be a part of this patient's care.  Claybon Jabsngel, [place] Hagger G 12/14/2016 6:31 PM

## 2016-12-14 NOTE — Progress Notes (Addendum)
Patient ID: Stormy CardKeyana A Hua, female   DOB: [birth year], 21 y.o.   MRN: [id number]  S: Called by RN due to temperature of 101.82F (axillary). Fetal tachycardia noted on FHT. Patient still laboring down with anterior lip.   O:  Vitals:   12/14/16 1730 12/14/16 1800 12/14/16 1806 12/14/16 1831  BP: (!) 140/91 (!) 147/92  136/84  Pulse: (!) 104 (!) 114  (!) 110  Resp: 20 18  20   Temp: 98.6 F (37 C)  (!) 101.5 F (38.6 C)   TempSrc: Oral  Axillary   SpO2:      Weight:      Height:        Dilation: Lip/rim Effacement (%): 100 Cervical Position: Anterior Station: +1 Presentation: Vertex (caput) Exam by:: Enis SlipperJane Bailey, RN   FHT: 165bpm, mod var, no accels, no decels TOCO: q452min   A/P: Start amp/gent Tylenol 1000mg  PO Continue expectant management Anticipate SVD

## 2016-12-14 NOTE — Progress Notes (Addendum)
Patient ID: Cristina [name], female   DOB: 13-Aug-1996, 21 y.o.   MRN: [id number]  S: Patient seen & examined for progress of labor. Patient comfortable with epidural.    O:  Vitals:   12/14/16 1200 12/14/16 1230 12/14/16 1300 12/14/16 1330  BP: (!) 146/95 (!) 141/90 138/81 131/81  Pulse: (!) 108 97 (!) 105 (!) 102  Resp: 18 18 20 18   Temp:  99.4 F (37.4 C)    TempSrc:  Axillary    SpO2:      Weight:      Height:        Dilation: 7 Effacement (%): 90 Cervical Position: Anterior Station: -1 Presentation: Vertex Exam by:: Enis SlipperJane Bailey, RN   CAT I FHT  A/P: AROM performed, clear fluid Continue expectant management Anticipate SVD

## 2016-12-15 ENCOUNTER — Encounter (HOSPITAL_COMMUNITY): Payer: Self-pay | Admitting: Anesthesiology

## 2016-12-15 ENCOUNTER — Encounter (HOSPITAL_COMMUNITY)
Admission: AD | Disposition: A | Discharge status: Home / Self Care | Payer: Self-pay | Source: Ambulatory Visit | Attending: Obstetrics & Gynecology

## 2016-12-15 DIAGNOSIS — O135 Gestational [pregnancy-induced] hypertension without significant proteinuria, complicating the puerperium: Secondary | ICD-10-CM

## 2016-12-15 DIAGNOSIS — Z3A40 40 weeks gestation of pregnancy: Secondary | ICD-10-CM

## 2016-12-15 DIAGNOSIS — O411230 Chorioamnionitis, third trimester, not applicable or unspecified: Secondary | ICD-10-CM

## 2016-12-15 LAB — CBC
HCT: 21.8 % — ABNORMAL LOW (ref 36.0–46.0)
HEMATOCRIT: 23.5 % — AB (ref 36.0–46.0)
HEMOGLOBIN: 8 g/dL — AB (ref 12.0–15.0)
Hemoglobin: 7.2 g/dL — ABNORMAL LOW (ref 12.0–15.0)
MCH: 25.8 pg — ABNORMAL LOW (ref 26.0–34.0)
MCH: 24.8 pg — ABNORMAL LOW (ref 26.0–34.0)
MCHC: 34 g/dL (ref 30.0–36.0)
MCHC: 33 g/dL (ref 30.0–36.0)
MCV: 75.8 fL — ABNORMAL LOW (ref 78.0–100.0)
MCV: 75.2 fL — AB (ref 78.0–100.0)
PLATELETS: 166 10*3/uL (ref 150–400)
Platelets: 162 10*3/uL (ref 150–400)
RBC: 3.1 MIL/uL — AB (ref 3.87–5.11)
RBC: 2.9 MIL/uL — ABNORMAL LOW (ref 3.87–5.11)
RDW: 15.9 % — ABNORMAL HIGH (ref 11.5–15.5)
RDW: 15.8 % — AB (ref 11.5–15.5)
WBC: 23.6 10*3/uL — AB (ref 4.0–10.5)
WBC: 28.2 10*3/uL — AB (ref 4.0–10.5)

## 2016-12-15 SURGERY — Surgical Case
Anesthesia: Epidural

## 2016-12-15 MED ORDER — METOCLOPRAMIDE HCL 5 MG/ML IJ SOLN: 10.0000 mg | Freq: Once | INTRAMUSCULAR | Status: DC | PRN

## 2016-12-15 MED ORDER — SIMETHICONE 80 MG PO CHEW
80.0000 mg | CHEWABLE_TABLET | ORAL | Status: DC
  Administered 2016-12-16 – 2016-12-18 (×4): 80 mg via ORAL
  Filled 2016-12-15 (×3): qty 1

## 2016-12-15 MED ORDER — OXYTOCIN 10 UNIT/ML IJ SOLN
INTRAMUSCULAR | Status: AC
  Filled 2016-12-15: qty 4

## 2016-12-15 MED ORDER — FENTANYL CITRATE (PF) 100 MCG/2ML IJ SOLN
INTRAMUSCULAR | Status: AC
  Filled 2016-12-15: qty 2

## 2016-12-15 MED ORDER — ACETAMINOPHEN 325 MG PO TABS
650.0000 mg | ORAL_TABLET | ORAL | Status: DC | PRN
  Administered 2016-12-15: 650 mg via ORAL
  Filled 2016-12-15: qty 2

## 2016-12-15 MED ORDER — LACTATED RINGERS IV SOLN
INTRAVENOUS | Status: DC
  Administered 2016-12-15 (×2): via INTRAVENOUS

## 2016-12-15 MED ORDER — ONDANSETRON HCL 4 MG/2ML IJ SOLN: 4.0000 mg | Freq: Three times a day (TID) | INTRAMUSCULAR | Status: DC | PRN

## 2016-12-15 MED ORDER — DIPHENHYDRAMINE HCL 25 MG PO CAPS: 25.0000 mg | ORAL_CAPSULE | Freq: Four times a day (QID) | ORAL | Status: DC | PRN

## 2016-12-15 MED ORDER — BUTALBITAL-APAP-CAFFEINE 50-325-40 MG PO TABS
1.0000 | ORAL_TABLET | ORAL | Status: DC | PRN
  Administered 2016-12-15 – 2016-12-16 (×2): 1 via ORAL
  Filled 2016-12-15 (×2): qty 1

## 2016-12-15 MED ORDER — ONDANSETRON HCL 4 MG/2ML IJ SOLN
INTRAMUSCULAR | Status: AC
  Filled 2016-12-15: qty 2

## 2016-12-15 MED ORDER — MAGNESIUM HYDROXIDE 400 MG/5ML PO SUSP: 30.0000 mL | ORAL | Status: DC | PRN

## 2016-12-15 MED ORDER — OXYCODONE-ACETAMINOPHEN 5-325 MG PO TABS
1.0000 | ORAL_TABLET | ORAL | Status: DC | PRN
  Administered 2016-12-16: 1 via ORAL
  Filled 2016-12-15: qty 1

## 2016-12-15 MED ORDER — DIBUCAINE 1 % RE OINT: 1.0000 "application " | TOPICAL_OINTMENT | RECTAL | Status: DC | PRN

## 2016-12-15 MED ORDER — MENTHOL 3 MG MT LOZG: 1.0000 | LOZENGE | OROMUCOSAL | Status: DC | PRN

## 2016-12-15 MED ORDER — OXYTOCIN 40 UNITS IN LACTATED RINGERS INFUSION - SIMPLE MED: 2.5000 [IU]/h | INTRAVENOUS | Status: AC

## 2016-12-15 MED ORDER — DIPHENHYDRAMINE HCL 50 MG/ML IJ SOLN: 12.5000 mg | INTRAMUSCULAR | Status: DC | PRN

## 2016-12-15 MED ORDER — NALOXONE HCL 0.4 MG/ML IJ SOLN: 0.4000 mg | INTRAMUSCULAR | Status: DC | PRN

## 2016-12-15 MED ORDER — SODIUM CHLORIDE 0.9 % IV SOLN
1000.0000 ug | Freq: Once | INTRAVENOUS | Status: AC
  Administered 2016-12-15: 1 mg via INTRAVENOUS
  Filled 2016-12-15: qty 1

## 2016-12-15 MED ORDER — BUPIVACAINE HCL (PF) 0.5 % IJ SOLN
INTRAMUSCULAR | Status: AC
  Filled 2016-12-15: qty 30

## 2016-12-15 MED ORDER — DEXAMETHASONE SODIUM PHOSPHATE 4 MG/ML IJ SOLN
INTRAMUSCULAR | Status: DC | PRN
  Administered 2016-12-15: 4 mg via INTRAVENOUS

## 2016-12-15 MED ORDER — SODIUM CHLORIDE 0.9% FLUSH: 3.0000 mL | INTRAVENOUS | Status: DC | PRN

## 2016-12-15 MED ORDER — SODIUM BICARBONATE 8.4 % IV SOLN
INTRAVENOUS | Status: DC | PRN
  Administered 2016-12-15 (×4): 5 mL via EPIDURAL

## 2016-12-15 MED ORDER — BUTALBITAL-APAP-CAFFEINE 50-325-40 MG PO TABS
2.0000 | ORAL_TABLET | Freq: Four times a day (QID) | ORAL | Status: DC | PRN
  Administered 2016-12-17 (×2): 2 via ORAL
  Filled 2016-12-15 (×2): qty 2

## 2016-12-15 MED ORDER — LACTATED RINGERS IV SOLN: INTRAVENOUS | Status: DC

## 2016-12-15 MED ORDER — MEPERIDINE HCL 25 MG/ML IJ SOLN
INTRAMUSCULAR | Status: AC
  Filled 2016-12-15: qty 1

## 2016-12-15 MED ORDER — BUPIVACAINE HCL (PF) 0.5 % IJ SOLN
INTRAMUSCULAR | Status: DC | PRN
  Administered 2016-12-15: 30 mL

## 2016-12-15 MED ORDER — NALBUPHINE HCL 10 MG/ML IJ SOLN: 5.0000 mg | Freq: Once | INTRAMUSCULAR | Status: DC | PRN

## 2016-12-15 MED ORDER — SCOPOLAMINE 1 MG/3DAYS TD PT72
MEDICATED_PATCH | TRANSDERMAL | Status: AC
  Filled 2016-12-15: qty 1

## 2016-12-15 MED ORDER — NALBUPHINE HCL 10 MG/ML IJ SOLN
5.0000 mg | INTRAMUSCULAR | Status: DC | PRN
  Administered 2016-12-15: 5 mg via INTRAVENOUS
  Filled 2016-12-15: qty 1

## 2016-12-15 MED ORDER — PRENATAL MULTIVITAMIN CH
1.0000 | ORAL_TABLET | Freq: Every day | ORAL | Status: DC
  Administered 2016-12-16 – 2016-12-17 (×2): 1 via ORAL
  Filled 2016-12-15 (×2): qty 1

## 2016-12-15 MED ORDER — SODIUM CHLORIDE 0.9 % IR SOLN
Status: DC | PRN
Start: 2016-12-15 — End: 2016-12-15
  Administered 2016-12-15: 1

## 2016-12-15 MED ORDER — LIDOCAINE-EPINEPHRINE (PF) 2 %-1:200000 IJ SOLN
INTRAMUSCULAR | Status: AC
  Filled 2016-12-15: qty 20

## 2016-12-15 MED ORDER — MORPHINE SULFATE (PF) 0.5 MG/ML IJ SOLN
INTRAMUSCULAR | Status: DC | PRN
  Administered 2016-12-15: 4 mg via EPIDURAL
  Administered 2016-12-15: 1 mg via INTRAVENOUS

## 2016-12-15 MED ORDER — SENNOSIDES-DOCUSATE SODIUM 8.6-50 MG PO TABS
2.0000 | ORAL_TABLET | ORAL | Status: DC
  Administered 2016-12-16 – 2016-12-18 (×3): 2 via ORAL
  Filled 2016-12-15 (×4): qty 2

## 2016-12-15 MED ORDER — DIPHENHYDRAMINE HCL 25 MG PO CAPS: 25.0000 mg | ORAL_CAPSULE | ORAL | Status: DC | PRN

## 2016-12-15 MED ORDER — CARBOPROST TROMETHAMINE 250 MCG/ML IM SOLN
INTRAMUSCULAR | Status: DC | PRN
  Administered 2016-12-15: 250 ug via INTRAMUSCULAR

## 2016-12-15 MED ORDER — CARBOPROST TROMETHAMINE 250 MCG/ML IM SOLN
INTRAMUSCULAR | Status: AC
  Filled 2016-12-15: qty 1

## 2016-12-15 MED ORDER — SIMETHICONE 80 MG PO CHEW
80.0000 mg | CHEWABLE_TABLET | ORAL | Status: DC | PRN
  Administered 2016-12-15: 80 mg via ORAL
  Filled 2016-12-15 (×2): qty 1

## 2016-12-15 MED ORDER — LACTATED RINGERS IV SOLN
INTRAVENOUS | Status: DC | PRN
  Administered 2016-12-15: 03:00:00 via INTRAVENOUS

## 2016-12-15 MED ORDER — FERROUS SULFATE 325 (65 FE) MG PO TABS
325.0000 mg | ORAL_TABLET | Freq: Two times a day (BID) | ORAL | Status: DC
  Administered 2016-12-15 – 2016-12-18 (×5): 325 mg via ORAL
  Filled 2016-12-15 (×5): qty 1

## 2016-12-15 MED ORDER — WITCH HAZEL-GLYCERIN EX PADS: 1.0000 "application " | MEDICATED_PAD | CUTANEOUS | Status: DC | PRN

## 2016-12-15 MED ORDER — COCONUT OIL OIL
1.0000 "application " | TOPICAL_OIL | Status: DC | PRN
  Administered 2016-12-18: 1 via TOPICAL
  Filled 2016-12-15: qty 120

## 2016-12-15 MED ORDER — TETANUS-DIPHTH-ACELL PERTUSSIS 5-2.5-18.5 LF-MCG/0.5 IM SUSP: 0.5000 mL | Freq: Once | INTRAMUSCULAR | Status: DC

## 2016-12-15 MED ORDER — MORPHINE SULFATE (PF) 0.5 MG/ML IJ SOLN
INTRAMUSCULAR | Status: AC
  Filled 2016-12-15: qty 10

## 2016-12-15 MED ORDER — IBUPROFEN 600 MG PO TABS
600.0000 mg | ORAL_TABLET | Freq: Four times a day (QID) | ORAL | Status: DC
  Administered 2016-12-15 – 2016-12-19 (×13): 600 mg via ORAL
  Filled 2016-12-15 (×13): qty 1

## 2016-12-15 MED ORDER — SODIUM BICARBONATE 8.4 % IV SOLN
INTRAVENOUS | Status: AC
  Filled 2016-12-15: qty 50

## 2016-12-15 MED ORDER — KETOROLAC TROMETHAMINE 30 MG/ML IJ SOLN: 30.0000 mg | Freq: Four times a day (QID) | INTRAMUSCULAR | Status: AC | PRN

## 2016-12-15 MED ORDER — ZOLPIDEM TARTRATE 5 MG PO TABS: 5.0000 mg | ORAL_TABLET | Freq: Every evening | ORAL | Status: DC | PRN

## 2016-12-15 MED ORDER — ONDANSETRON HCL 4 MG/2ML IJ SOLN
INTRAMUSCULAR | Status: DC | PRN
  Administered 2016-12-15: 4 mg via INTRAVENOUS

## 2016-12-15 MED ORDER — SCOPOLAMINE 1 MG/3DAYS TD PT72
1.0000 | MEDICATED_PATCH | Freq: Once | TRANSDERMAL | Status: DC
  Filled 2016-12-15: qty 1

## 2016-12-15 MED ORDER — NALOXONE HCL 2 MG/2ML IJ SOSY
1.0000 ug/kg/h | PREFILLED_SYRINGE | INTRAVENOUS | Status: DC | PRN
  Filled 2016-12-15: qty 2

## 2016-12-15 MED ORDER — NALBUPHINE HCL 10 MG/ML IJ SOLN: 5.0000 mg | INTRAMUSCULAR | Status: DC | PRN

## 2016-12-15 MED ORDER — SCOPOLAMINE 1 MG/3DAYS TD PT72
MEDICATED_PATCH | TRANSDERMAL | Status: DC | PRN
  Administered 2016-12-15: 1 via TRANSDERMAL

## 2016-12-15 MED ORDER — MEPERIDINE HCL 25 MG/ML IJ SOLN: 6.2500 mg | INTRAMUSCULAR | Status: DC | PRN

## 2016-12-15 MED ORDER — MEPERIDINE HCL 25 MG/ML IJ SOLN
INTRAMUSCULAR | Status: DC | PRN
  Administered 2016-12-15 (×2): 12.5 mg via INTRAVENOUS

## 2016-12-15 MED ORDER — OXYTOCIN 10 UNIT/ML IJ SOLN
INTRAVENOUS | Status: DC | PRN
  Administered 2016-12-15: 40 [IU] via INTRAVENOUS

## 2016-12-15 MED ORDER — DEXAMETHASONE SODIUM PHOSPHATE 4 MG/ML IJ SOLN
INTRAMUSCULAR | Status: AC
  Filled 2016-12-15: qty 1

## 2016-12-15 MED ORDER — FENTANYL CITRATE (PF) 100 MCG/2ML IJ SOLN
25.0000 ug | INTRAMUSCULAR | Status: DC | PRN
  Administered 2016-12-15: 50 ug via INTRAVENOUS
  Administered 2016-12-15: 25 ug via INTRAVENOUS
  Administered 2016-12-15: 50 ug via INTRAVENOUS

## 2016-12-15 MED ORDER — OXYCODONE-ACETAMINOPHEN 5-325 MG PO TABS
2.0000 | ORAL_TABLET | ORAL | Status: DC | PRN
  Administered 2016-12-16 – 2016-12-18 (×4): 2 via ORAL
  Filled 2016-12-15 (×5): qty 2

## 2016-12-15 MED ORDER — MEASLES, MUMPS & RUBELLA VAC ~~LOC~~ INJ
0.5000 mL | INJECTION | Freq: Once | SUBCUTANEOUS | Status: DC
  Filled 2016-12-15: qty 0.5

## 2016-12-15 MED ORDER — CEFAZOLIN SODIUM-DEXTROSE 2-4 GM/100ML-% IV SOLN
INTRAVENOUS | Status: AC
  Filled 2016-12-15: qty 100

## 2016-12-15 SURGICAL SUPPLY — 32 items
CHLORAPREP W/TINT 26ML (MISCELLANEOUS) ×3 IMPLANT
CLAMP CORD UMBIL (MISCELLANEOUS) IMPLANT
CLOTH BEACON ORANGE TIMEOUT ST (SAFETY) ×3 IMPLANT
DRSG OPSITE POSTOP 4X10 (GAUZE/BANDAGES/DRESSINGS) ×3 IMPLANT
ELECT REM PT RETURN 9FT ADLT (ELECTROSURGICAL) ×3
ELECTRODE REM PT RTRN 9FT ADLT (ELECTROSURGICAL) ×1 IMPLANT
EXTRACTOR VACUUM M CUP 4 TUBE (SUCTIONS) IMPLANT
EXTRACTOR VACUUM M CUP 4' TUBE (SUCTIONS)
GLOVE BIOGEL PI IND STRL 7.0 (GLOVE) ×3 IMPLANT
GLOVE BIOGEL PI INDICATOR 7.0 (GLOVE) ×6
GLOVE ECLIPSE 7.0 STRL STRAW (GLOVE) ×3 IMPLANT
GOWN STRL REUS W/TWL LRG LVL3 (GOWN DISPOSABLE) ×6 IMPLANT
KIT ABG SYR 3ML LUER SLIP (SYRINGE) IMPLANT
NDL HYPO 25X5/8 SAFETYGLIDE (NEEDLE) ×1 IMPLANT
NEEDLE HYPO 22GX1.5 SAFETY (NEEDLE) ×3 IMPLANT
NEEDLE HYPO 25X5/8 SAFETYGLIDE (NEEDLE) ×3 IMPLANT
NS IRRIG 1000ML POUR BTL (IV SOLUTION) ×3 IMPLANT
PACK C SECTION WH (CUSTOM PROCEDURE TRAY) ×3 IMPLANT
PAD ABD 7.5X8 STRL (GAUZE/BANDAGES/DRESSINGS) ×3 IMPLANT
PAD ABD 8X7 1/2 STERILE (GAUZE/BANDAGES/DRESSINGS) ×2 IMPLANT
PAD OB MATERNITY 4.3X12.25 (PERSONAL CARE ITEMS) ×3 IMPLANT
PENCIL SMOKE EVAC W/HOLSTER (ELECTROSURGICAL) ×3 IMPLANT
RTRCTR C-SECT PINK 25CM LRG (MISCELLANEOUS) IMPLANT
SPONGE GAUZE 4X4 12PLY STER LF (GAUZE/BANDAGES/DRESSINGS) ×4 IMPLANT
SUT PDS AB 0 CTX 36 PDP370T (SUTURE) IMPLANT
SUT PLAIN 2 0 XLH (SUTURE) IMPLANT
SUT VIC AB 0 CTX 36 (SUTURE) ×9
SUT VIC AB 0 CTX36XBRD ANBCTRL (SUTURE) ×2 IMPLANT
SUT VIC AB 4-0 KS 27 (SUTURE) ×3 IMPLANT
SYR CONTROL 10ML LL (SYRINGE) ×3 IMPLANT
TOWEL OR 17X24 6PK STRL BLUE (TOWEL DISPOSABLE) ×3 IMPLANT
TRAY FOLEY CATH SILVER 14FR (SET/KITS/TRAYS/PACK) ×3 IMPLANT

## 2016-12-15 NOTE — Progress Notes (Signed)
Complaining of headache when sitting up and states it goes away when she lies down. Dr Arby BarretteHatchett notified. No new orders given at this time.

## 2016-12-15 NOTE — Progress Notes (Signed)
Patient ID: Cristina [name], female   DOB: 09/13/1996, 21 y.o.   MRN: [id number]  S: Patient requested vacuum assistance.    O:  Vitals:   12/14/16 2349 12/15/16 0030 12/15/16 0100 12/15/16 0130  BP:  (!) 157/66 (!) 126/97 115/90  Pulse:  (!) 108 (!) 105 96  Resp:  (!) 22 20 20   Temp: 98.3 F (36.8 C)     TempSrc: Oral     SpO2:      Weight:      Height:       FHT: 135 bpm, mod var, no accels, variable decels TOCO: q3-4 min  Dilation: 10 Dilation Complete Date: 12/14/16 Dilation Complete Time: 1940 Effacement (%): 100 Cervical Position: Middle Station: +2 Presentation: Vertex Exam by:: Dr. Macon LargeAnyanwu Station changes to +3/+4 with pushing  Vacuum assistance attempt Indication for operative vaginal delivery: Failure of cervical descent Risks of vacuum assistance were discussed in detail, including but not limited to, bleeding, infection, damage to maternal tissues, fetal cephalohematoma, inability to effect vaginal delivery of the head or shoulder dystocia that cannot be resolved by established maneuvers and need for emergency cesarean section.  Patient gave verbal consent. Patient was examined and found to be fully dilated with fetal station of +2. The soft vacuum soft cup was positioned over the sagittal suture 3 cm anterior to posterior fontanelle.  Pressure was then increased to 500 mmHg, and the patient was instructed to push.  Pulling was administered along the pelvic curve.  3 pulls was administered during 4 pushes, 3 popoffs.  The fetal head was noted to descend to +4 station and retract back to +2 station a few times, concerning for shoulder dystocia.  The vacuum assistance was then aborted and cesarean delivery was recommended.   A/P: Prolonged 2nd stage; failure of descent; failed operative vaginal delivery attempt. Category I FHR tracing.  Cesarean section recommended at this point.  The risks of cesarean section discussed with the patient included but were not limited to:  bleeding which may require transfusion or reoperation; infection which may require antibiotics; injury to bowel, bladder, ureters or other surrounding organs; injury to the fetus; need for additional procedures including hysterectomy in the event of a life-threatening hemorrhage; placental abnormalities wth subsequent pregnancies, incisional problems, thromboembolic phenomenon and other postoperative/anesthesia complications. The patient concurred with the proposed plan, giving informed written consent for the procedure.   Anesthesia and OR aware. Preoperative prophylactic antibiotics and SCDs ordered on call to the OR.  To OR when ready.    Jaynie CollinsUGONNA  ANYANWU, MD, FACOG Attending Obstetrician & Gynecologist, [facility], [facility]

## 2016-12-15 NOTE — Progress Notes (Signed)
Anesthesia headache evaluation  Pt seen and examined. MDA called this afternoon, but per patient she has not seen anyone from anesthesia and no orders were given. She is complaining of frontal headache that is exacerbated with upright posture or ambulating (8-9/10) and relieved when recumbent (3-4/10). She denies any other sensory issues, diplopia, or weakness. Pupils ERRL.  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 I discussed with patient in detail that my first step is always conservative measures, and then EBP if those fail. I spoke with Dr. Shawnie PonsPratt as well. I will give cosyntropin and Fioricet and plan to re-evaluate in the AM.   Cristina [name]

## 2016-12-15 NOTE — Progress Notes (Signed)
Weight and measurements entered in from Loetta RoughEmmy Adkins paper

## 2016-12-15 NOTE — Transfer of Care (Signed)
Immediate Anesthesia Transfer of Care Note  Patient: Stormy CardKeyana A Ostrum  Procedure(s) Performed: Procedure(s): CESAREAN SECTION (N/A)  Patient Location: PACU  Anesthesia Type:Epidural  Level of Consciousness: awake, alert  and oriented  Airway & Oxygen Therapy: Patient Spontanous Breathing  Post-op Assessment: Report given to RN and Post -op Vital signs reviewed and stable  Post vital signs: Reviewed and stable  Last Vitals:  Vitals:   12/15/16 0200 12/15/16 0215  BP: 138/88 (!) 141/93  Pulse: 100 (!) 105  Resp: 18 18  Temp:      Last Pain:  Vitals:   12/14/16 2349  TempSrc: Oral  PainSc:          Complications: No apparent anesthesia complications

## 2016-12-15 NOTE — Progress Notes (Signed)
Patient ID: Cristina [name], female   DOB: [birth year], 21 y.o.   MRN: [id number]  S: Patient has been pushing for about three hours.  She is very tired and wants to discuss interventions.  Comfortable with epidural.  O:  Vitals:   12/14/16 2230 12/14/16 2330 12/14/16 2349 12/15/16 0030  BP: (!) 147/78 140/75  (!) 157/66  Pulse: (!) 117 98  (!) 108  Resp: 18 20  (!) 22  Temp:   98.3 F (36.8 C)   TempSrc:   Oral   SpO2:      Weight:      Height:       Dilation: 10 Dilation Complete Date: 12/14/16 Dilation Complete Time: 1940 Effacement (%): 100 Cervical Position: Middle Station: +2 Presentation: Vertex Exam by:: Dr. Macon LargeAnyanwu Station changes to +3/+4 with pushing  FHT: 145bpm, mod var, no accels, variable decels TOCO: q3-4 min   A/P: Prolonged 2nd stage. Discussed expectant management vs vacuum assistance vs cesarean section. Risks of all modalities discussed.  She wants to continue pushing for now. FHR overall reassuring. Will reevaluate in one hour.  Continue amp/gent for Triple I Hopeful for SVD    Jaynie CollinsUGONNA  Ebelin Dillehay, MD, FACOG Attending Obstetrician & Gynecologist, [facility], [facility]

## 2016-12-15 NOTE — Op Note (Addendum)
Cristina [name] PROCEDURE DATE: 12/15/2016  PREOPERATIVE DIAGNOSES: Intrauterine pregnancy at [redacted]w[redacted]d weeks gestation; gestational hypertension; chorioamnionitis, failure to progress: arrest of descent and failed operative vaginal delivery  POSTOPERATIVE DIAGNOSES: The same and macrosomia.  PROCEDURE: Primary Low Transverse Cesarean Section  SURGEON:  Dr. Jaynie Collins  ANESTHESIOLOGIST: Dr. Saddie Benders  INDICATIONS: Cristina [name] is a 21 y.o. G1P1001 at [redacted]w[redacted]d here for cesarean section secondary to the indications listed under preoperative diagnoses; please see preoperative note for further details.  The risks of cesarean section were discussed with the patient including but were not limited to: bleeding which may require transfusion or reoperation; infection which may require antibiotics; injury to bowel, bladder, ureters or other surrounding organs; injury to the fetus; need for additional procedures including hysterectomy in the event of a life-threatening hemorrhage; placental abnormalities wth subsequent pregnancies, incisional problems, thromboembolic phenomenon and other postoperative/anesthesia complications.   The patient concurred with the proposed plan, giving informed written consent for the procedure.    FINDINGS:  Viable female infant in cephalic presentation.  Loose nuchal cord x 1. Apgars 8 and 9. Infant weight in OR 10 lb 6 oz.  Clear amniotic fluid.  Intact placenta, three vessel cord.  Normal uterus, fallopian tubes and ovaries bilaterally. Significant uterine atony requiring administration of Hemabate in OR.  ANESTHESIA: Epidural INTRAVENOUS FLUIDS: 2600 ml ESTIMATED BLOOD LOSS: 1200 ml URINE OUTPUT:  200 ml SPECIMENS: Placenta sent to pathology COMPLICATIONS: None immediate  PROCEDURE IN DETAIL:  The patient preoperatively received intravenous antibiotics and had sequential compression devices applied to her lower extremities.  She was then taken to the operating room  where the epidural anesthesia was dosed up to surgical level and was found to be adequate. She was then placed in a dorsal supine position with a leftward tilt, and prepped and draped in a sterile manner.  A foley catheter was placed into her bladder and attached to constant gravity.  After an adequate timeout was performed, a Pfannenstiel skin incision was made with scalpel and carried through to the underlying layer of fascia. The fascia was incised in the midline, and this incision was extended bilaterally using the Mayo scissors.  Kocher clamps were applied to the superior aspect of the fascial incision and the underlying rectus muscles were dissected off bluntly.  A similar process was carried out on the inferior aspect of the fascial incision. The rectus muscles were separated in the midline bluntly and the peritoneum was entered bluntly. Attention was turned to the lower uterine segment where a low transverse hysterotomy was made with a scalpel and extended bilaterally bluntly.  The infant was successfully delivered, the cord was clamped and cut after one minute, and the infant was handed over to the awaiting neonatology team. Uterine massage was then administered, and the placenta delivered intact with a three-vessel cord. The uterus was then cleared of clots and debris. There was significant bleeding around hysterotomy due to multiple open blood vessels and atony, this was controlled using uterotonics and ring forceps.   The hysterotomy was closed with 0 Vicryl in a running locked fashion, and an imbricating layer was also placed with 0 Vicryl.  Figure-of-eight 0 Vicryl serosal stitches were placed to help with hemostasis.  The pelvis was cleared of all clot and debris. Hemostasis was confirmed on all surfaces.  The peritoneum and the rectus muscles were reapproximated using 0 Vicryl interrupted stitches. The fascia was then closed using 0 PDS in a running fashion.  The subcutaneous layer  was irrigated,  then reapproximated with 2-0 plain gut interrupted stitches, and 30 ml of 0.5% Marcaine was injected subcutaneously around the incision.  The skin was closed with a 4-0 Vicryl subcuticular stitch. The patient tolerated the procedure well. Sponge, lap, instrument and needle counts were correct x 3.  She was taken to the recovery room in stable condition.    Jaynie CollinsUGONNA  [name], MD, FACOG Attending Obstetrician & Gynecologist Faculty Practice, [facility]

## 2016-12-15 NOTE — Progress Notes (Signed)
Day shift Rn called provider again regarding patient's headache. Clelia CroftShaw CNM stated she would have the Attending  Doctor  call anesthesia again.

## 2016-12-15 NOTE — Anesthesia Postprocedure Evaluation (Signed)
Anesthesia Post Note  Patient: Cristina [name]  Procedure(s) Performed: Procedure(s) (LRB): CESAREAN SECTION (N/A)  Patient location during evaluation: Mother Baby Anesthesia Type: Epidural Level of consciousness: awake and alert and oriented Pain management: pain level controlled Vital Signs Assessment: post-procedure vital signs reviewed and stable Respiratory status: spontaneous breathing and nonlabored ventilation Cardiovascular status: stable Postop Assessment: no headache, no backache, patient able to bend at knees, no signs of nausea or vomiting and adequate PO intake Anesthetic complications: no        Last Vitals:  Vitals:   12/15/16 0720 12/15/16 0820  BP: 136/67 137/64  Pulse: 100 93  Resp: 20 18  Temp: 37.2 C 36.9 C    Last Pain:  Vitals:   12/15/16 0820  TempSrc: Oral  PainSc: 3    Pain Goal:                 Madison HickmanGREGORY,Shawntavia Saunders

## 2016-12-15 NOTE — Lactation Note (Signed)
This note was copied from a baby's chart. Lactation Consultation Note  Patient Name: Cristina Daleen SquibbKeyana Fort WUJWJ'XToday's Date: 12/15/2016 Reason for consult: Initial assessment  Baby 12 hours old. Mom just finished attempting to nurse, but reports that baby is sleepy at breast. Assisted mom with positioning and hand expression, but no colostrum present yet. Discussed progression of milk coming to volume. Undressed baby and discussed benefits of STS. Baby still sleepy and would night latch. Enc mom to continue offering lots of STS and attempting to nurse. Mom given [facility], aware of OP/BFSG and LC phone line assistance after D/C.  Maternal Data Has patient been taught Hand Expression?: Yes  Feeding Feeding Type: Breast Fed Length of feed: 0 min  LATCH Score/Interventions Latch: Too sleepy or reluctant, no latch achieved, no sucking elicited. Intervention(s): Skin to skin;Teach feeding cues;Waking techniques Intervention(s): Adjust position;Assist with latch;Breast compression  Audible Swallowing: None Intervention(s): Skin to skin;Hand expression  Type of Nipple: Everted at rest and after stimulation  Comfort (Breast/Nipple): Soft / non-tender     Hold (Positioning): Assistance needed to correctly position infant at breast and maintain latch. Intervention(s): Breastfeeding basics reviewed;Support Pillows;Position options;Skin to skin  LATCH Score: 5  Lactation Tools Discussed/Used     Consult Status Consult Status: Follow-up Date: 12/16/16 Follow-up type: In-patient    Sherlyn HayJennifer D Roshawnda Pecora 12/15/2016, 3:10 PM

## 2016-12-15 NOTE — Progress Notes (Signed)
Philipp DeputyKim Shaw notified c/o headache & no other PIH symptoms at this time. She will order Fioricet.

## 2016-12-16 ENCOUNTER — Inpatient Hospital Stay (HOSPITAL_COMMUNITY): Payer: Managed Care, Other (non HMO) | Admitting: Anesthesiology

## 2016-12-16 LAB — CBC
HEMATOCRIT: 18 % — AB (ref 36.0–46.0)
HEMOGLOBIN: 6.2 g/dL — AB (ref 12.0–15.0)
MCH: 26.1 pg (ref 26.0–34.0)
MCHC: 34.4 g/dL (ref 30.0–36.0)
MCV: 75.6 fL — AB (ref 78.0–100.0)
PLATELETS: 162 10*3/uL (ref 150–400)
RBC: 2.38 MIL/uL — AB (ref 3.87–5.11)
RDW: 16.2 % — ABNORMAL HIGH (ref 11.5–15.5)
WBC: 22.4 10*3/uL — AB (ref 4.0–10.5)

## 2016-12-16 MED ORDER — KETOROLAC TROMETHAMINE 30 MG/ML IJ SOLN
15.0000 mg | Freq: Once | INTRAMUSCULAR | Status: DC
  Filled 2016-12-16: qty 1

## 2016-12-16 MED ORDER — FENTANYL CITRATE (PF) 100 MCG/2ML IJ SOLN: 50.0000 ug | Freq: Once | INTRAMUSCULAR | Status: DC | PRN

## 2016-12-16 MED ORDER — MIDAZOLAM HCL 2 MG/2ML IJ SOLN: 1.0000 mg | Freq: Once | INTRAMUSCULAR | Status: DC | PRN

## 2016-12-16 NOTE — Lactation Note (Signed)
This note was copied from a baby's chart. Lactation Consultation Note  Patient Name: Cristina [name] WUJWJ'XToday's Date: 12/16/2016 Reason for consult: Follow-up assessment;Difficult latch Mom has headache and not able to sit up for long periods. Assisted Mom with latching baby in side lying position to right breast. This nipple is erect and compressible. Baby is tongue thrusting so took several attempts to get baby to latch. LC demonstrating using breast compression to latch and to help baby sustain latch. After nursing for several minutes baby was able to demonstrate a good suckling pattern with swallowing motions noted. Some intermittent chewing observed as well. After BF for 25 minutes, demonstrated giving supplement by finger feeding using curved tipped syringe. Feeding plan discussed for now. Try to BF with feeding ques, 8-12 times or more in 24 hours. Left nipple is flat and thick, not very compressible. May need to pre-pump and use nipple shield to help with latch.  Mom to supplement with feedings till baby latching consistently. Follow guidelines per hours of age. Post pump every 3 hours to encourage milk production. If Mom not able to pump due to headache, keep working on latch. Ask for assist and when able to sit up for longer periods will revisit using nipple shield on left breast.  Call for assist with feedings.   Maternal Data    Feeding Feeding Type: Breast Fed Length of feed: 25 min  LATCH Score/Interventions Latch: Repeated attempts needed to sustain latch, nipple held in mouth throughout feeding, stimulation needed to elicit sucking reflex. Intervention(s): Adjust position;Assist with latch;Breast massage;Breast compression  Audible Swallowing: A few with stimulation  Type of Nipple: Flat (left nipple is flat, right everted, short shaft) Intervention(s): Double electric pump  Comfort (Breast/Nipple): Soft / non-tender     Hold (Positioning): Assistance needed to  correctly position infant at breast and maintain latch. Intervention(s): Breastfeeding basics reviewed;Support Pillows;Position options;Skin to skin  LATCH Score: 6  Lactation Tools Discussed/Used Tools: Pump;Nipple Shields Nipple shield size: 20;24 Breast pump type: Double-Electric Breast Pump   Consult Status Consult Status: Follow-up Date: 12/17/16 Follow-up type: In-patient    Cristina LevinsGranger, Cristina [name] 12/16/2016, 4:11 PM

## 2016-12-16 NOTE — Progress Notes (Signed)
Pt transferred to PACU for procedure via stretcher. Vital signs noted.

## 2016-12-16 NOTE — Progress Notes (Signed)
Pt continues to complain of headache that worsens with ambulation. Wynelle BourgeoisMarie Williams CNM notified. No new orders given. Instructions given to notify Anesthesia. Will continue to monitor pt.

## 2016-12-16 NOTE — Anesthesia Procedure Notes (Signed)
Epidural Blood Patch  Start time: 12/16/2016 5:30 PM End time: 12/16/2016 5:35 PM  Staffing Anesthesiologist: Cristina [name], Cristina [name] Performed: anesthesiologist   Preanesthetic Checklist Completed: patient identified, surgical consent, pre-op evaluation, timeout performed, IV checked, risks and benefits discussed and monitors and equipment checked  Epidural Patient position: sitting Prep: site prepped and draped and DuraPrep Patient monitoring: blood pressure, continuous pulse ox, heart rate and cardiac monitor Approach: midline Location: L3-L4 Injection technique: LOR air  Needle:  Needle type: Tuohy  Needle gauge: 17 G Needle length: 9 cm Needle insertion depth: 6.5 cm  Assessment Events: blood not aspirated, injection not painful, no injection resistance, negative IV test and no paresthesia  Additional Notes Pt identified in PACU.  Monitors applied. Working IV access confirmed. Sterile prep, drape lumbar spine.  1% lido local L 3,4.  #17ga Touhy LOR air at 6.5 cm L 3,4. Total 18cc blood, obtained sterilly by A Rickelton, CRNA injected easily, untikl pt appresiated pressure in her back. Pt reports immediate relief of headache.  Patient asymptomatic, VSS, no heme aspirated, tolerated well.  Cristina [name], MDReason for block:Epidural Blood Patch

## 2016-12-16 NOTE — Progress Notes (Signed)
Anesthesiology headache evaluation, post LEA for labor and Caesarian section.  Pt seen and examined.  Pt continues to complain of frontal headache that is totally relieved in the supine position, exascerbated when upright. VSS. Of note, pt is anemic post op with Hb 6.2.   I agree that this is consistent with a post dural puncture headache. Discussed natural history of resolution of the headache on its own, vs epidural blood patch. Risks, benefits of epidural blood patch, including, but not limited to, infection, neural irritation, failure, and headache, discussed and accepted by patient. She wishes to proceed with blood patch.  Sandford Craze Charne Mcbrien, MD

## 2016-12-16 NOTE — Progress Notes (Signed)
Pts VSS, states she has no headache and only slight soreness to epidural site.  Dressing to back CDI, will transfer back to mother baby.

## 2016-12-16 NOTE — Progress Notes (Signed)
Pt in PACU for epidural Blood Patch by Dr. Jairo Benarswell Jackson, assisted by Aundra MilletAngela Rickleton CRNA.  Time out performed and blood patch completed, pt tolerated procedure well, will monitor for one hour and reassess. Pt lying flat and VSS.

## 2016-12-16 NOTE — Progress Notes (Addendum)
Subjective: Postpartum Day 1: Cesarean Delivery Patient reports incisional pain and tolerating PO.   Still has headache post conservative treatment for possible spinal headache States "they are gonna do that patch"   Objective: Vital signs in last 24 hours: Temp:  [98 F (36.7 C)-99.2 F (37.3 C)] 98 F (36.7 C) (02/17 0506) Pulse Rate:  [80-105] 87 (02/17 0506) Resp:  [18-20] 18 (02/17 0506) BP: (113-136)/(55-68) 123/62 (02/17 0506) SpO2:  [94 %-100 %] 100 % (02/17 0240)  Physical Exam:  General: alert, cooperative and no distress Lochia: appropriate Uterine Fundus: firm Incision: healing well, no significant drainage DVT Evaluation: No evidence of DVT seen on physical exam.   Recent Labs  12/15/16 0951 12/16/16 0533  HGB 7.2* 6.2*  HCT 21.8* 18.0*    Assessment/Plan: Status post Cesarean section. Doing well postoperatively.  Continue current care. Anesthesia to follow  Wynelle BourgeoisMarie Guerino Caporale 12/16/2016, 12:50 PM

## 2016-12-16 NOTE — Progress Notes (Signed)
Dr Jackson notified about pt's conJean Rosenthaltinued complaint of headache  per Wynelle BourgeoisMarie Williams CNM. No new orders. Dr Jean RosenthalJackson stated she would be by to evaluate pt shortly. Will continue to monitor pt.

## 2016-12-16 NOTE — Progress Notes (Signed)
Rn notified Philipp DeputyKim Shaw CNM regarding Hemoglobin of 6.2 . No further order were given. Patient still complains of headache.

## 2016-12-16 NOTE — Anesthesia Preprocedure Evaluation (Signed)
Anesthesia Evaluation  Patient identified by MRN, date of birth, ID band Patient awake    Reviewed: Allergy & Precautions, NPO status , Patient's Chart, lab work & pertinent test results  History of Anesthesia Complications Negative for: history of anesthetic complications  Airway Mallampati: II  TM Distance: >3 FB Neck ROM: Full    Dental  (+) Dental Advisory Given   Pulmonary neg pulmonary ROS,    breath sounds clear to auscultation       Cardiovascular negative cardio ROS   Rhythm:Regular Rate:Normal     Neuro/Psych  Headaches (symptoms consistent with post dural puncture headache), Postural headache    GI/Hepatic Neg liver ROS, GERD  ,  Endo/Other  Morbid obesity  Renal/GU negative Renal ROS     Musculoskeletal   Abdominal (+) + obese,   Peds  Hematology  (+) Blood dyscrasia (Hb 6.2), anemia ,   Anesthesia Other Findings   Reproductive/Obstetrics Post partum C-section                             Anesthesia Physical Anesthesia Plan  ASA: II  Anesthesia Plan: Epidural   Post-op Pain Management:    Induction:   Airway Management Planned: Natural Airway  Additional Equipment:   Intra-op Plan:   Post-operative Plan:   Informed Consent: I have reviewed the patients History and Physical, chart, labs and discussed the procedure including the risks, benefits and alternatives for the proposed anesthesia with the patient or authorized representative who has indicated his/her understanding and acceptance.   Dental advisory given  Plan Discussed with: CRNA and Surgeon  Anesthesia Plan Comments: (Plan routine monitors, Epidural blood patch)        Anesthesia Quick Evaluation

## 2016-12-17 ENCOUNTER — Inpatient Hospital Stay (HOSPITAL_COMMUNITY): Payer: Managed Care, Other (non HMO) | Admitting: Anesthesiology

## 2016-12-17 ENCOUNTER — Encounter (HOSPITAL_COMMUNITY): Payer: Self-pay | Admitting: Anesthesiology

## 2016-12-17 ENCOUNTER — Other Ambulatory Visit (HOSPITAL_COMMUNITY): Payer: Self-pay | Admitting: *Deleted

## 2016-12-17 ENCOUNTER — Inpatient Hospital Stay (HOSPITAL_COMMUNITY): Admission: RE | Admit: 2016-12-17 | Payer: Managed Care, Other (non HMO) | Source: Ambulatory Visit

## 2016-12-17 ENCOUNTER — Ambulatory Visit (HOSPITAL_COMMUNITY): Payer: Managed Care, Other (non HMO)

## 2016-12-17 LAB — CBC WITH DIFFERENTIAL/PLATELET
BASOS ABS: 0 10*3/uL (ref 0.0–0.1)
BASOS PCT: 0 %
EOS ABS: 0 10*3/uL (ref 0.0–0.7)
Eosinophils Relative: 0 %
HEMATOCRIT: 18.9 % — AB (ref 36.0–46.0)
HEMOGLOBIN: 6.5 g/dL — AB (ref 12.0–15.0)
LYMPHS PCT: 13 %
Lymphs Abs: 2.7 10*3/uL (ref 0.7–4.0)
MCH: 25.9 pg — AB (ref 26.0–34.0)
MCHC: 33.9 g/dL (ref 30.0–36.0)
MCV: 76.5 fL — ABNORMAL LOW (ref 78.0–100.0)
MONO ABS: 0.8 10*3/uL (ref 0.1–1.0)
MONOS PCT: 4 %
NEUTROS PCT: 83 %
Neutro Abs: 17.5 10*3/uL — ABNORMAL HIGH (ref 1.7–7.7)
OTHER: 0 %
Platelets: 208 10*3/uL (ref 150–400)
RBC: 2.47 MIL/uL — ABNORMAL LOW (ref 3.87–5.11)
RDW: 16.3 % — ABNORMAL HIGH (ref 11.5–15.5)
WBC: 21 10*3/uL — ABNORMAL HIGH (ref 4.0–10.5)

## 2016-12-17 MED ORDER — FENTANYL CITRATE (PF) 100 MCG/2ML IJ SOLN
INTRAMUSCULAR | Status: AC
  Filled 2016-12-17: qty 2

## 2016-12-17 MED ORDER — MIDAZOLAM HCL 2 MG/2ML IJ SOLN: 2.0000 mg | Freq: Once | INTRAMUSCULAR | Status: DC | PRN

## 2016-12-17 MED ORDER — FENTANYL CITRATE (PF) 100 MCG/2ML IJ SOLN
100.0000 ug | Freq: Once | INTRAMUSCULAR | Status: AC | PRN
  Administered 2016-12-17 (×2): 50 ug via INTRAVENOUS

## 2016-12-17 NOTE — Progress Notes (Addendum)
Anesthesia eval  Pt continues to complain of a headache. She did receive relief yesterday after the EBP, but pain returned later in the evening. Her pain now is mild. She states it gets worse when she bends her back to move around in bed, is worse as she is lying flat in bed, but the pain is almost absent when she is ambulating. It doesn't appear that she is getting the Fioricet. I asked the nurse to give Fioricet. Service midwife was who notified me, and I mentioned that other sources of headache may need to be evaluated. She said Dr. Marice Potterove will round on patient and see what else should be pursued. I recommend pushing fluids, fioricet and I will evaluate her later today. It no longer sounds like PDPH given that she is able to ambulate without significant symptoms.    Seen again at ~1515. States she is comfortable when recumbent and standing, but headache starts anytime she is in the sitting position. She has only been up to take a shower today. I discussed with her that it may still be PDPH, but that there may be other factors contributing to it, namely her anemia and she may also be hypovolemic. I offered EBP to her again and discussed the risks in detail and she has elected to have a second EBP.

## 2016-12-17 NOTE — Progress Notes (Addendum)
At 2050, Pt called out and c/o SOB- denies chest or calf pain. Resp unlabored but tachypneic. Bil breath sounds clear- O2 sat 96%. Pt has nonproductive cough that she states started after blood patch procedure. Temp elevated at 100.1 but pt in bed with bathrobe on and covers. Bathrobe removed- will recheck V/S and notify MD

## 2016-12-17 NOTE — Anesthesia Preprocedure Evaluation (Signed)
Anesthesia Evaluation  Patient identified by MRN, date of birth, ID band Patient awake    Reviewed: Allergy & Precautions, NPO status , Patient's Chart, lab work & pertinent test results  History of Anesthesia Complications Negative for: history of anesthetic complications  Airway Mallampati: II  TM Distance: >3 FB Neck ROM: Full    Dental  (+) Dental Advisory Given   Pulmonary neg pulmonary ROS,    breath sounds clear to auscultation       Cardiovascular hypertension,  Rhythm:Regular Rate:Normal     Neuro/Psych  Headaches (symptoms consistent with post dural puncture headache), Postural headache    GI/Hepatic Neg liver ROS, GERD  ,  Endo/Other  Morbid obesity  Renal/GU negative Renal ROS     Musculoskeletal   Abdominal (+) + obese,   Peds  Hematology  (+) Blood dyscrasia (Hb 6.2), anemia ,   Anesthesia Other Findings   Reproductive/Obstetrics Post partum C-section                             Anesthesia Physical  Anesthesia Plan  ASA: II  Anesthesia Plan: Epidural   Post-op Pain Management:    Induction:   Airway Management Planned:   Additional Equipment:   Intra-op Plan:   Post-operative Plan:   Informed Consent:   Plan Discussed with: CRNA  Anesthesia Plan Comments: (Plan routine monitors, Epidural blood patch)        Anesthesia Quick Evaluation

## 2016-12-17 NOTE — Anesthesia Procedure Notes (Signed)
Epidural Patient location during procedure: post-op Start time: 12/17/2016 4:42 PM End time: 12/17/2016 4:50 PM  Staffing Anesthesiologist: Lewie LoronGERMEROTH, Brandis Wixted Performed: anesthesiologist   Preanesthetic Checklist Completed: patient identified, pre-op evaluation, timeout performed, IV checked, risks and benefits discussed and monitors and equipment checked  Epidural Patient position: sitting Prep: site prepped and draped and DuraPrep Patient monitoring: heart rate, continuous pulse ox, blood pressure and cardiac monitor Approach: midline Location: L2-L3 Injection technique: LOR air and LOR saline  Needle:  Needle type: Tuohy  Needle gauge: 17 G Needle length: 9 cm Needle insertion depth: 6 cm Catheter type: closed end flexible Catheter size: 19 Gauge Test dose: negative  Assessment Sensory level: T8 Events: blood not aspirated, injection not painful, no injection resistance, negative IV test and no paresthesia  Additional Notes Reason for block:Epidural Blood Patch

## 2016-12-17 NOTE — Progress Notes (Signed)
Patient states walking around pain level is 1 ( headache) but in the process of sitting and standing pain level is a 6 (headache).

## 2016-12-17 NOTE — Progress Notes (Signed)
Pt with persistent headache after blood patch yesterday.  Repeat blood patch performed and patient tolerated procedure well, VSS.  Pt supine for 1 hour after blood patch.  Transferred back to floor, Headache gone while lying.

## 2016-12-17 NOTE — Progress Notes (Signed)
Subjective: Postpartum Day 2: Cesarean Delivery Patient reports incisional pain, tolerating PO, + flatus and no problems voiding.  Also c/o continued headache that abates when lying flat.  Objective: Vital signs in last 24 hours: Temp:  [98.1 F (36.7 C)-99.1 F (37.3 C)] 98.1 F (36.7 C) (02/18 0412) Pulse Rate:  [93-109] 96 (02/18 0412) Resp:  [16-21] 18 (02/18 0412) BP: (129-143)/(65-84) 129/68 (02/18 0412) SpO2:  [93 %-100 %] 94 % (02/17 1849)  Physical Exam:  General: alert, cooperative, appears stated age and no distress Lochia: appropriate Uterine Fundus: firm Incision: healing well, no significant drainage, no dehiscence, no significant erythema DVT Evaluation: No evidence of DVT seen on physical exam.   Recent Labs  12/16/16 0533 12/17/16 0619  HGB 6.2* 6.5*  HCT 18.0* 18.9*    Assessment/Plan: Status post Cesarean section. Doing well postoperatively.  Increase po fluids and caffine.  Cristina [name] 12/17/2016, 9:18 AM

## 2016-12-17 NOTE — Progress Notes (Signed)
Dr Earlene PlaterWallace notified of pt's c/o SOB and V/S. No orders- MD states she will come to see patient

## 2016-12-18 LAB — PREPARE RBC (CROSSMATCH)

## 2016-12-18 LAB — HEMATOCRIT: HCT: 24.2 % — ABNORMAL LOW (ref 36.0–46.0)

## 2016-12-18 LAB — HEMOGLOBIN: Hemoglobin: 8.3 g/dL — ABNORMAL LOW (ref 12.0–15.0)

## 2016-12-18 MED ORDER — SODIUM CHLORIDE 0.9 % IV SOLN: Freq: Once | INTRAVENOUS | Status: DC

## 2016-12-18 MED ORDER — SODIUM CHLORIDE 0.9 % IV SOLN
Freq: Once | INTRAVENOUS | Status: AC
  Administered 2016-12-18: 09:00:00 via INTRAVENOUS

## 2016-12-18 NOTE — Progress Notes (Signed)
Subjective: Postpartum Day 3: Cesarean Delivery Patient reports incisional pain, tolerating PO, + flatus and no problems voiding.  Pt continues to have severe pain unless she is laying flat despite second blood patch.  Objective: Vital signs in last 24 hours: Temp:  [98 F (36.7 C)-100.2 F (37.9 C)] 98 F (36.7 C) (02/19 0550) Pulse Rate:  [95-114] 107 (02/18 2320) Resp:  [18-26] 20 (02/19 0550) BP: (120-153)/(56-87) 131/79 (02/19 0550) SpO2:  [90 %-98 %] 95 % (02/18 2320)  Physical Exam:  General: alert, cooperative, appears stated age and no distress Lochia: appropriate Uterine Fundus: firm Incision: healing well, no significant drainage, no dehiscence, no significant erythema DVT Evaluation: No evidence of DVT seen on physical exam.   Recent Labs  12/16/16 0533 12/17/16 0619  HGB 6.2* 6.5*  HCT 18.0* 18.9*    Assessment/Plan: Status post Cesarean section. Postoperative course complicated by spinal headache  Continue current care.  Cristina [name] 12/18/2016, 7:15 AM

## 2016-12-18 NOTE — Lactation Note (Signed)
This note was copied from a baby's chart. Lactation Consultation Note     I spoke to mom's nurse, Derry Skillonna L, and asked if I should go and see this mom. I was told she was sleeping, still had a severe headache, and was receiving blood for a hgb of 6.5. I was told to let mom rest at this time.  Lupita LeashDonna did see mom latch the baby earlier today, around 1200, and the baby fed for 15 minutes. The bay is with MGM in the room, getting mostly formula fed at this time. Lupita LeashDonna will let me know when she thinks mom would be ready for a lactation consult.    Patient Name: Cristina Daleen SquibbKeyana Beveridge ZOXWR'UToday's Date: 12/18/2016 Reason for consult: Follow-up assessment   Maternal Data    Feeding Feeding Type: Formula Length of feed: 20 min  LATCH Score/Interventions Latch: Repeated attempts needed to sustain latch, nipple held in mouth throughout feeding, stimulation needed to elicit sucking reflex. Intervention(s): Skin to skin Intervention(s): Adjust position;Assist with latch;Breast massage;Breast compression  Audible Swallowing: Spontaneous and intermittent Intervention(s): Hand expression  Type of Nipple: Everted at rest and after stimulation  Comfort (Breast/Nipple): Soft / non-tender  Problem noted: Filling  Hold (Positioning): Assistance needed to correctly position infant at breast and maintain latch. Intervention(s): Breastfeeding basics reviewed;Position options;Skin to skin  LATCH Score: 8  Lactation Tools Discussed/Used     Consult Status Consult Status: Follow-up Date: 12/18/16 Follow-up type: In-patient    Alfred LevinsLee, Leen Tworek Anne 12/18/2016, 1:50 PM

## 2016-12-18 NOTE — Lactation Note (Addendum)
This note was copied from a baby's chart. Lactation Consultation Note LC to consult w/mom d/t baby poor feeder. Mom had spinal HA. Had blood patch. Sleeping. Baby in CN. Baby BF some during the night, then syring fed formula for mom to rest d/t pain.  Patient Name: Boy Daleen SquibbKeyana Probasco [id number]'s Date: 12/18/2016     Maternal Data    Feeding Feeding Type: Formula  LATCH Score/Interventions                      Lactation Tools Discussed/Used     Consult Status      Vasco Chong, Diamond NickelLAURA G 12/18/2016, 7:30 AM

## 2016-12-18 NOTE — Progress Notes (Signed)
Patient lying in bed. Denies HA, but states she gets HA when she sits up. States HA started shortly after her C/Section. She states neither blood patch helped her HA. She is anemic with Hb of 6.5. She is going to be transfused. There is no report of dural puncture during placement of her epidural. At present I am uncertain as to whether her HA is due to dural puncture or not. If her HA does not improve after her transfusion would get Neuro consult and consider imaging to delineate other possible etiologies of HA.

## 2016-12-18 NOTE — Lactation Note (Signed)
This note was copied from a baby's chart. Lactation Consultation Note Mom BF for short intervals. Will not BF longer d/t mom says baby is full, just being spoiled, Per RN. Asked RN to encouraged to BF longer, LC unavailable to consult at this time. Patient Name: Cristina Daleen SquibbKeyana Bero ZOXWR'UToday's Date: 12/18/2016     Maternal Data    Feeding Feeding Type: Formula  LATCH Score/Interventions                      Lactation Tools Discussed/Used     Consult Status      Charyl DancerCARVER, [place] Carmack G 12/18/2016, 7:33 AM

## 2016-12-19 LAB — TYPE AND SCREEN
ABO/RH(D): O POS
ANTIBODY SCREEN: NEGATIVE
UNIT DIVISION: 0
Unit division: 0

## 2016-12-19 MED ORDER — IBUPROFEN 600 MG PO TABS: 600.0000 mg | ORAL_TABLET | Freq: Four times a day (QID) | ORAL | 0 refills | Status: DC

## 2016-12-19 MED ORDER — NORETHINDRONE 0.35 MG PO TABS: 1.0000 | ORAL_TABLET | Freq: Every day | ORAL | 11 refills | Status: DC

## 2016-12-19 MED ORDER — OXYCODONE-ACETAMINOPHEN 5-325 MG PO TABS: 1.0000 | ORAL_TABLET | ORAL | 0 refills | Status: DC | PRN

## 2016-12-19 MED ORDER — FERROUS SULFATE 325 (65 FE) MG PO TABS: 325.0000 mg | ORAL_TABLET | Freq: Two times a day (BID) | ORAL | 3 refills | Status: DC

## 2016-12-19 NOTE — Discharge Summary (Signed)
Obstetric Discharge Summary Reason for Admission: 40 1/[redacted] weeks EGA, early labor, PIH Prenatal Procedures: ultrasound Intrapartum Procedures: cesarean: low cervical, transverse Postpartum Procedures: transfusion 3 units PRBC, blood patch x 2 Complications-Operative and Postpartum: spinal headache and hemorrhage Hemoglobin  Date Value Ref Range Status  12/18/2016 8.3 (L) 12.0 - 15.0 g/dL Final    Comment:    DELTA CHECK NOTED REPEATED TO VERIFY POST TRANSFUSION SPECIMEN    HCT  Date Value Ref Range Status  12/18/2016 24.2 (L) 36.0 - 46.0 % Final   Hematocrit  Date Value Ref Range Status  09/14/2016 34.0 34.0 - 46.6 % Final    Physical Exam:  General: alert Lochia: appropriate Uterine Fundus: firm Incision: healing well DVT Evaluation: No evidence of DVT seen on physical exam.  Discharge Diagnoses: Term Pregnancy-delivered  Her headache resolved after transfusion and 2 blood patches.  Discharge Information: Date: 12/19/2016 Activity: pelvic rest Diet: routine Medications: PNV, Ibuprofen, Percocet and Micronor Condition: stable Instructions: refer to practice specific booklet Discharge to: home Follow-up Information    HARPER,CHARLES A, MD Follow up.   Specialty:  Obstetrics and Gynecology Contact information: [address] [place] [address] [phone]        Roe Coombsachelle A Denney, CNM. Schedule an appointment as soon as possible for a visit in 6 week(s).   Specialty:  Certified Nurse Midwife Contact information: [address] [place] [address] [phone]           Newborn Data: Live born female  Birth Weight: 10 lb 8.4 oz (4774 g) APGAR: 8, 9  Home with mother.  Finleigh Cheong C Keyunna Coco 12/19/2016, 8:10 AM

## 2016-12-19 NOTE — Lactation Note (Signed)
This note was copied from a baby's chart. Lactation Consultation Note  Patient Name: Cristina [name]: 12/19/2016 Reason for consult: Follow-up assessment;Difficult latch Mom reports still having mild headache but much better. She declines to work on latch before d/c today but reports a desire to pump/bottle and maybe work on latch as OP. Mom left message with [facility] regarding DEBP, referral faxed by LC. [facility] and [facility] will advise. Mom has not pumped since yesterday, baby taking mostly bottles. Stressed importance of pumping every 3 hours to encourage milk production, prevent engorgement and protect milk supply. Discussed storage guidelines and cleaning of pump pieces. Engorgement care reviewed if needed. Advised if wants baby to go to breast to try with some feedings but scheduled OP f/u with lactation for Friday, 12/22/16 at 1:00pm to work on latch once her milk is in. Advised Mom to increase supplements to 30-60 ml every 3 hours. Advised at 394 days old and big baby would probably take 60 ml with most feedings. Mom to call for questions/concerns or if desire pump rental.   Maternal Data    Feeding Feeding Type: Formula (baby attempted to breastfeed fighting breast) Nipple Type: Slow - flow  LATCH Score/Interventions                      Lactation Tools Discussed/Used Tools: Pump Breast pump type: Double-Electric Breast Pump   Consult Status Consult Status: Complete [name]: 12/19/16 Follow-up type: In-patient    Cristina [name], Cristina [name] 12/19/2016, 9:38 AM

## 2016-12-19 NOTE — Discharge Instructions (Signed)
Hypertension Hypertension, commonly called high blood pressure, is when the force of blood pumping through your arteries is too strong. Your arteries are the blood vessels that carry blood from your heart throughout your body. A blood pressure reading consists of a higher number over a lower number, such as 110/72. The higher number (systolic) is the pressure inside your arteries when your heart pumps. The lower number (diastolic) is the pressure inside your arteries when your heart relaxes. Ideally you want your blood pressure below 120/80. Hypertension forces your heart to work harder to pump blood. Your arteries may become narrow or stiff. Having untreated or uncontrolled hypertension can cause heart attack, stroke, kidney disease, and other problems. What increases the risk? Some risk factors for high blood pressure are controllable. Others are not. Risk factors you cannot control include:  Race. You may be at higher risk if you are African American.  Age. Risk increases with age.  Gender. Men are at higher risk than women before age 5 years. After age 42, women are at higher risk than men. Risk factors you can control include:  Not getting enough exercise or physical activity.  Being overweight.  Getting too much fat, sugar, calories, or salt in your diet.  Drinking too much alcohol. What are the signs or symptoms? Hypertension does not usually cause signs or symptoms. Extremely high blood pressure (hypertensive crisis) may cause headache, anxiety, shortness of breath, and nosebleed. How is this diagnosed? To check if you have hypertension, your health care provider will measure your blood pressure while you are seated, with your arm held at the level of your heart. It should be measured at least twice using the same arm. Certain conditions can cause a difference in blood pressure between your right and left arms. A blood pressure reading that is higher than normal on one occasion does  not mean that you need treatment. If it is not clear whether you have high blood pressure, you may be asked to return on a different day to have your blood pressure checked again. Or, you may be asked to monitor your blood pressure at home for 1 or more weeks. How is this treated? Treating high blood pressure includes making lifestyle changes and possibly taking medicine. Living a healthy lifestyle can help lower high blood pressure. You may need to change some of your habits. Lifestyle changes may include:  Following the DASH diet. This diet is high in fruits, vegetables, and whole grains. It is low in salt, red meat, and added sugars.  Keep your sodium intake below 2,300 mg per day.  Getting at least 30-45 minutes of aerobic exercise at least 4 times per week.  Losing weight if necessary.  Not smoking.  Limiting alcoholic beverages.  Learning ways to reduce stress. Your health care provider may prescribe medicine if lifestyle changes are not enough to get your blood pressure under control, and if one of the following is true:  You are 76-39 years of age and your systolic blood pressure is above 140.  You are 22 years of age or older, and your systolic blood pressure is above 150.  Your diastolic blood pressure is above 90.  You have diabetes, and your systolic blood pressure is over 161 or your diastolic blood pressure is over 90.  You have kidney disease and your blood pressure is above 140/90.  You have heart disease and your blood pressure is above 140/90. Your personal target blood pressure may vary depending on your medical  conditions, your age, and other factors. Follow these instructions at home:  Have your blood pressure rechecked as directed by your health care provider.  Take medicines only as directed by your health care provider. Follow the directions carefully. Blood pressure medicines must be taken as prescribed. The medicine does not work as well when you skip  doses. Skipping doses also puts you at risk for problems.  Do not smoke.  Monitor your blood pressure at home as directed by your health care provider. Contact a health care provider if:  You think you are having a reaction to medicines taken.  You have recurrent headaches or feel dizzy.  You have swelling in your ankles.  You have trouble with your vision. Get help right away if:  You develop a severe headache or confusion.  You have unusual weakness, numbness, or feel faint.  You have severe chest or abdominal pain.  You vomit repeatedly.  You have trouble breathing. This information is not intended to replace advice given to you by your health care provider. Make sure you discuss any questions you have with your health care provider. Document Released: 10/16/2005 Document Revised: 03/23/2016 Document Reviewed: 08/08/2013 Elsevier Interactive Patient Education  2017 [place].  Iron-Rich Diet Introduction Iron is a mineral that helps your body to produce hemoglobin. Hemoglobin is a protein in your red blood cells that carries oxygen to your body's tissues. Eating too little iron may cause you to feel weak and tired, and it can increase your risk for infection. Eating enough iron is necessary for your body's metabolism, muscle function, and nervous system. Iron is naturally found in many foods. It can also be added to foods or fortified in foods. There are two types of dietary iron:  Heme iron. Heme iron is absorbed by the body more easily than nonheme iron. Heme iron is found in meat, poultry, and fish.  Nonheme iron. Nonheme iron is found in dietary supplements, iron-fortified grains, beans, and vegetables. You may need to follow an iron-rich diet if:  You have been diagnosed with iron deficiency or iron-deficiency anemia.  You have a condition that prevents you from absorbing dietary iron, such as:  Infection in your intestines.  Celiac disease. This involves  long-lasting (chronic) inflammation of your intestines.  You do not eat enough iron.  You eat a diet that is high in foods that impair iron absorption.  You have lost a lot of blood.  You have heavy bleeding during your menstrual cycle.  You are pregnant. What is my plan? Your health care provider may help you to determine how much iron you need per day based on your condition. Generally, when a person consumes sufficient amounts of iron in the diet, the following iron needs are met:  Men.  74-44 years old: 11 mg per day.  19-54 years old: 8 mg per day.  Women.  58-57 years old: 15 mg per day.  77-36 years old: 18 mg per day.  Over 58 years old: 8 mg per day.  Pregnant women: 27 mg per day.  Breastfeeding women: 9 mg per day. What do I need to know about an iron-rich diet?  Eat fresh fruits and vegetables that are high in vitamin C along with foods that are high in iron. This will help increase the amount of iron that your body absorbs from food, especially with foods containing nonheme iron. Foods that are high in vitamin C include oranges, peppers, tomatoes, and mango.  Take iron supplements  only as directed by your health care provider. Overdose of iron can be life-threatening. If you were prescribed iron supplements, take them with orange juice or a vitamin C supplement.  Cook foods in pots and pans that are made from iron.  Eat nonheme iron-containing foods alongside foods that are high in heme iron. This helps to improve your iron absorption.  Certain foods and drinks contain compounds that impair iron absorption. Avoid eating these foods in the same meal as iron-rich foods or with iron supplements. These include:  Coffee, black tea, and red wine.  Milk, dairy products, and foods that are high in calcium.  Beans, soybeans, and peas.  Whole grains.  When eating foods that contain both nonheme iron and compounds that impair iron absorption, follow these tips to  absorb iron better.  Soak beans overnight before cooking.  Soak whole grains overnight and drain them before using.  Ferment flours before baking, such as using yeast in bread dough. What foods can I eat? Grains  Iron-fortified breakfast cereal. Iron-fortified whole-wheat bread. Enriched rice. Sprouted grains. Vegetables  Spinach. Potatoes with skin. Green peas. Broccoli. Red and green bell peppers. Fermented vegetables. Fruits  Prunes. Raisins. Oranges. Strawberries. Mango. Grapefruit. Meats and Other Protein Sources  Beef liver. Oysters. Beef. Shrimp. [place]. Chicken. [place]. Sardines. Chickpeas. Nuts. Tofu. Beverages  Tomato juice. Fresh orange juice. Prune juice. Hibiscus tea. Fortified instant breakfast shakes. Condiments  Tahini. Fermented soy sauce. Sweets and Desserts  Black-strap molasses. Other  Wheat germ. The items listed above may not be a complete list of recommended foods or beverages. Contact your dietitian for more options.  What foods are not recommended? Grains  Whole grains. Bran cereal. Bran flour. Oats. Vegetables  Artichokes. Brussels sprouts. Kale. Fruits  Blueberries. Raspberries. Strawberries. Figs. Meats and Other Protein Sources  Soybeans. Products made from soy protein. Dairy  Milk. Cream. Cheese. Yogurt. Cottage cheese. Beverages  Coffee. Black tea. Red wine. Sweets and Desserts  Cocoa. Chocolate. Ice cream. Other  Basil. Oregano. Parsley. The items listed above may not be a complete list of foods and beverages to avoid. Contact your dietitian for more information.  This information is not intended to replace advice given to you by your health care provider. Make sure you discuss any questions you have with your health care provider. Document Released: 05/30/2005 Document Revised: 05/05/2016 Document Reviewed: 05/13/2014  2017 Elsevier

## 2017-01-29 ENCOUNTER — Ambulatory Visit: Payer: Managed Care, Other (non HMO) | Admitting: Obstetrics & Gynecology

## 2017-03-01 IMAGING — US US MFM OB FOLLOW-UP
1 series · 13 of 28 positions shown · non-contrast
Comparison: none

[Series 1: us mfm ob follow-up · 49 acquisitions, 13 frames shown]
[im 2/49]
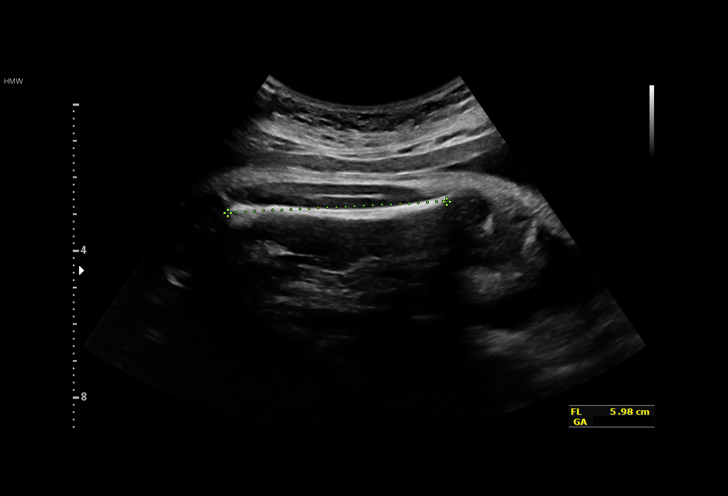
[im 6/49]
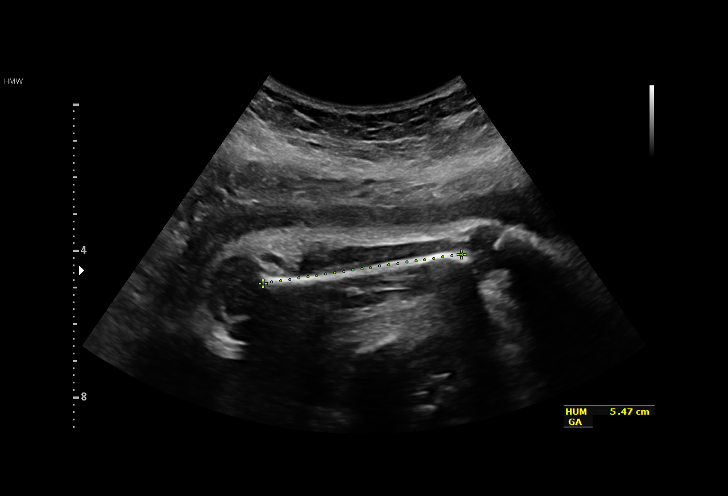
[im 9/49]
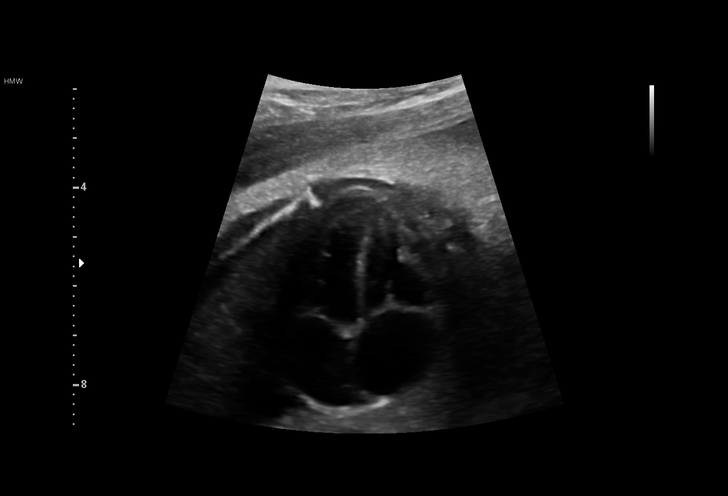
[im 13/49]
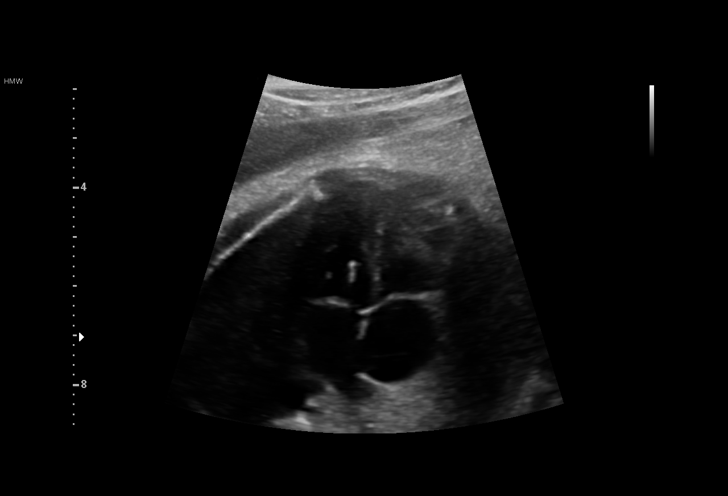
[im 17/49]
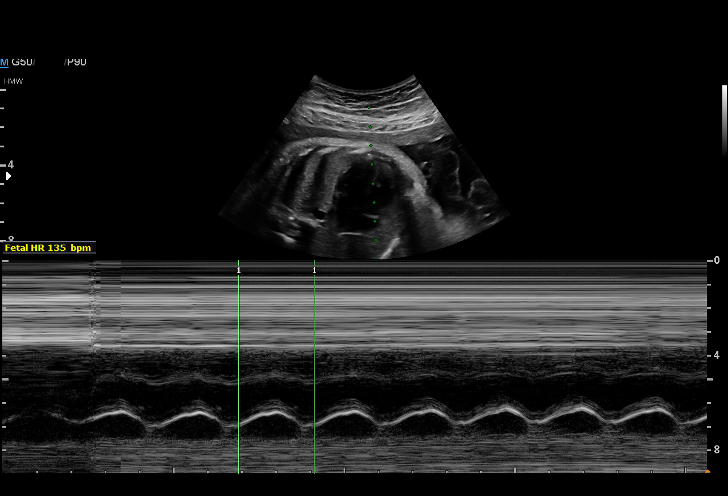
[im 20/49]
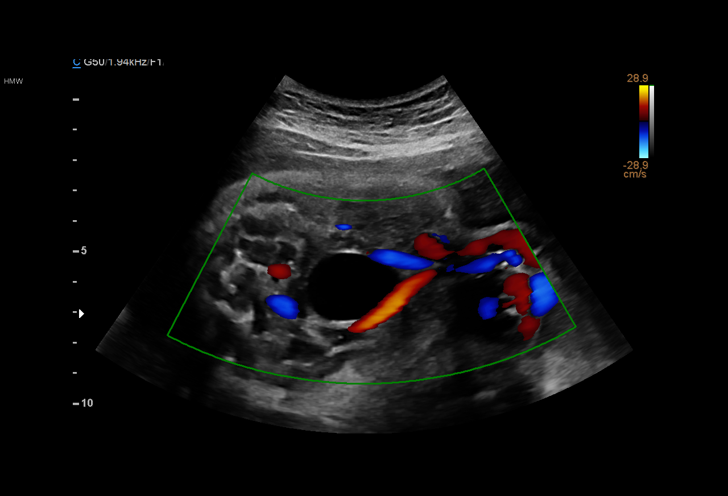
[im 25/49]
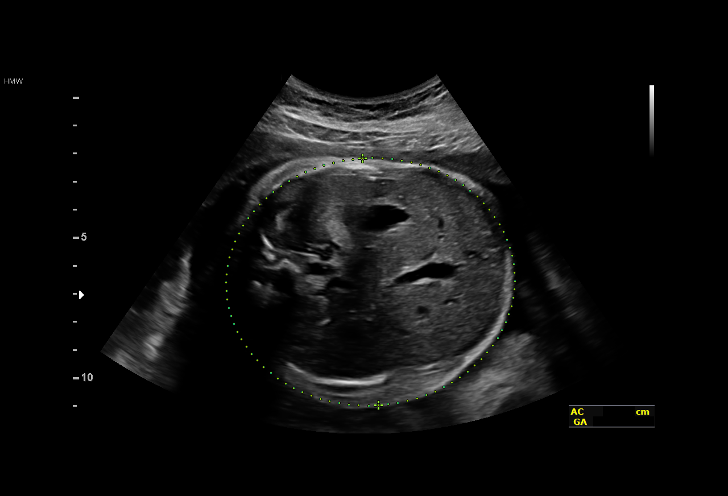
[im 29/49]
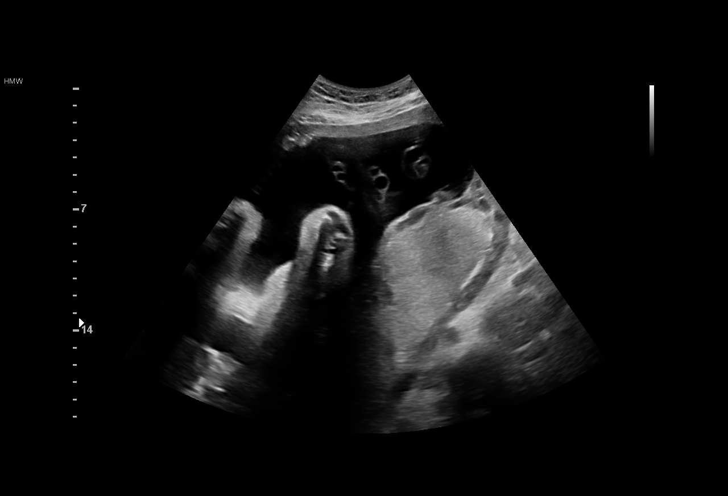
[im 33/49]
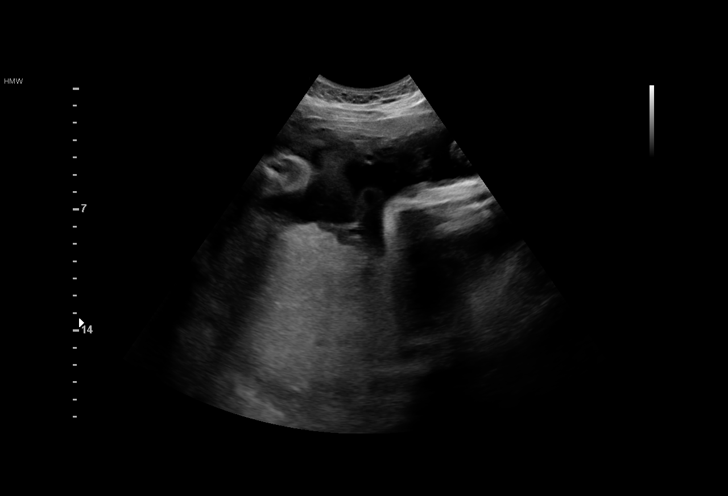
[im 36/49]
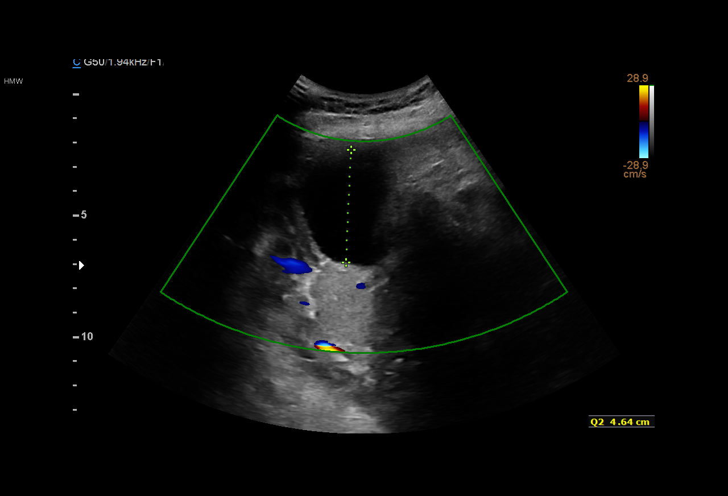
[im 40/49]
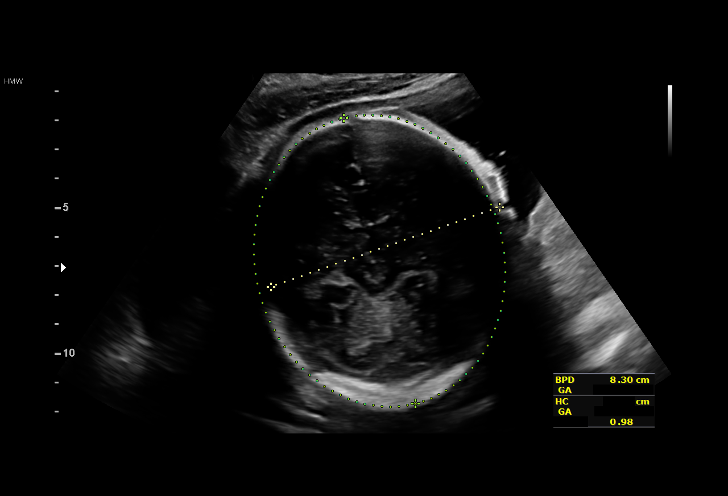
[im 43/49]
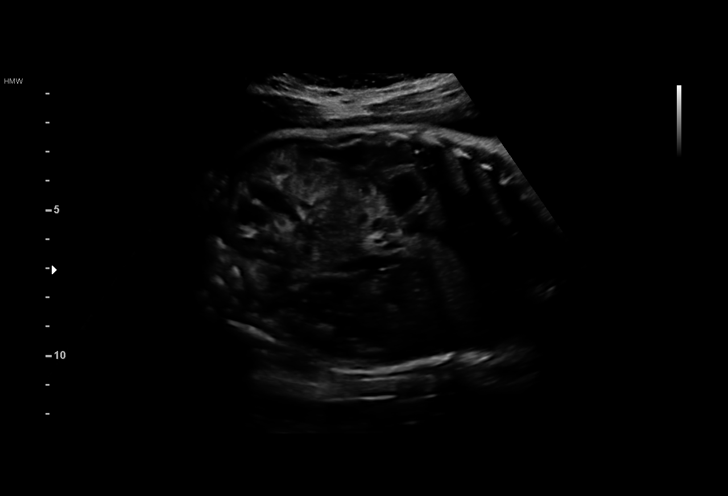
[im 47/49]
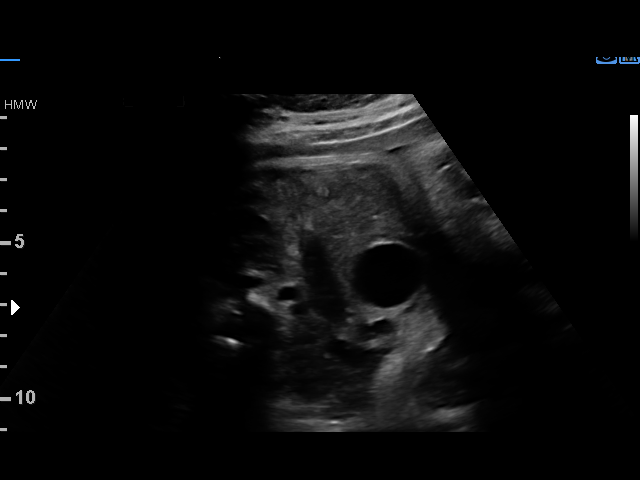

[13 of 28 positions shown; findings below may reference images not displayed]

Name:       LEVON JANG                Visit Date:  10/11/2016 [DATE]

1  ARISSA BILLIOT            [id number]      [id number]     [id number]
Indications

31 weeks gestation of pregnancy
Fetal abnormality - other known or
suspected (EIF, echogenic bowel seen in
office)- panorama normal
OB History

Gravidity:    1         Term:   0        Prem:   0        SAB:   0
TOP:          0       Ectopic:  0        Living: 0
Fetal Evaluation

Num Of Fetuses:     1
Fetal Heart         135
Rate(bpm):
Cardiac Activity:   Observed
Presentation:       Cephalic
Placenta:           Posterior, above cervical os
P. Cord Insertion:  Visualized, central

Amniotic Fluid
AFI FV:      Subjectively within normal limits

AFI Sum(cm)     %Tile       Largest Pocket(cm)
18.05           67

RUQ(cm)       RLQ(cm)       LUQ(cm)        LLQ(cm)
2.62
Biometry
BPD:      83.1  mm     G. Age:  33w 3d         91  %    CI:        80.17   %   70 - 86
FL/HC:      20.4   %   19.3 -
HC:      293.2  mm     G. Age:  32w 2d         38  %    HC/AC:      0.98       0.96 -
AC:      300.1  mm     G. Age:  34w 0d       > 97  %    FL/BPD:     72.0   %   71 - 87
FL:       59.8  mm     G. Age:  31w 1d         29  %    FL/AC:      19.9   %   20 - 24
HUM:      55.6  mm     G. Age:  32w 3d         70  %

Est. FW:    9331  gm    4 lb 10 oz      79  %
Gestational Age

LMP:           36w 4d       Date:   01/29/16                 EDD:   11/04/16
U/S Today:     32w 5d                                        EDD:   12/01/16
Best:          31w 3d    Det. By:   Previous Ultrasound      EDD:   12/10/16
(07/06/16)
Anatomy

Cranium:               Appears normal         Aortic Arch:            Previously seen
Cavum:                 Previously seen        Ductal Arch:            Previously seen
Ventricles:            Appears normal         Diaphragm:              Previously seen
Choroid Plexus:        Previously seen        Stomach:                Appears normal, left
sided
Cerebellum:            Previously seen        Abdomen:                Echogenic Bowel
Posterior Fossa:       Previously seen        Abdominal Wall:         Previously seen
Nuchal Fold:           Previously seen        Cord Vessels:           Previously seen
enlarged
Face:                  Orbits and profile     Kidneys:                Appear normal
previously seen
Lips:                  Previously seen        Bladder:                Appears normal
Thoracic:              Appears normal         Spine:                  Previously seen
Heart:                 Echogenic focus        Upper Extremities:      Previously seen
in LV
RVOT:                  Previously seen        Lower Extremities:      Previously seen
LVOT:                  Appears normal

Other:  Parents do not wish to know sex of fetus at this time. Heels and 5th
digit visualized previously. Nasal bone visualized previously. Open
hands visualized previously.
Cervix Uterus Adnexa

Cervix
Not visualized (advanced GA >16wks)

Uterus
No abnormality visualized.

Left Ovary
No adnexal mass visualized.

Right Ovary
No adnexal mass visualized.

Cul De Sac:   No free fluid seen.

Adnexa:       No abnormality visualized.
Impression

Singleton intrauterine pregnancy at 31+3 weeks with fetal EIF
and mildly echogenic bowel. All genetic and infectious testing
is negative
Review of the anatomy continues to show a nonpathologic-
appearing EIF in the left ventricle and mildly echogenic bowel
that is not as echogenic as the surrounding bone. There are
no other sonographic markers for aneuploidy or structural
anomalies seen
Amniotic fluid volume is normal with an AFI of 18.1cm
Estimated fetal weight is 2087g which is growth in the 79th
percentile
Recommendations

Given the stability of the findings and normal testing, no
further US are necessary in MFM. Follow-up ultrasounds as
clinically indicated.

## 2017-05-10 NOTE — Anesthesia Postprocedure Evaluation (Signed)
Anesthesia Post Note  Patient: Cristina [name]  Procedure(s) Performed: Procedure(s) (LRB): CESAREAN SECTION (N/A)     Anesthesia Post Evaluation  Last Vitals:  Vitals:   12/18/16 1828 12/19/16 0634  BP: 137/74 (!) 143/80  Pulse: 90 81  Resp: 20 18  Temp: 37.6 C 36.8 C    Last Pain:  Vitals:   12/19/16 0820  TempSrc:   PainSc: 3                  Phillips Groutarignan, Lora Glomski

## 2017-05-10 NOTE — Addendum Note (Signed)
Addendum  created 05/10/17 1404 by Phillips Groutarignan, Anola Mcgough, MD   Sign clinical note

## 2017-09-17 ENCOUNTER — Encounter: Payer: Self-pay | Admitting: Physician Assistant

## 2017-09-17 ENCOUNTER — Ambulatory Visit: Payer: Managed Care, Other (non HMO) | Admitting: Physician Assistant

## 2017-09-17 ENCOUNTER — Other Ambulatory Visit: Payer: Self-pay

## 2017-09-17 VITALS — BP 124/76 | HR 98 | Temp 98.1°F | Resp 16 | Ht 65.0 in | Wt 221.0 lb

## 2017-09-17 DIAGNOSIS — F411 Generalized anxiety disorder: Secondary | ICD-10-CM | POA: Diagnosis not present

## 2017-09-17 NOTE — Patient Instructions (Addendum)
I think you would a great candidate for getting with a counselor.  I am placing some recommended counselors in the area.  You can also look at guidance counselors on [email or web address].  There you can see there specialties, locations, and pictures.  Let me know if you need a referral from any of these, for insurance purposes.  It also has great strategies for helping anxiety, parenting, life changes, etc.  I would like you to increase your exercise to 4 times per week.  This can be the walking, running, dancing (zumba, etc.).  I would like you to also journal every other day to every 2 days.  This can be about your day.  What is bothering you, hopes, aspirations, thoughts, fears, poetry, etc.   I would like to see you back in 5-8 weeks.    IF you received an x-ray today, you will receive an invoice from [facility]. Please contact [facility] at [phone] with questions or concerns regarding your invoice.   IF you received labwork today, you will receive an invoice from [facility]. Please contact [facility] at [phone] with questions or concerns regarding your invoice.   Our billing staff will not be able to assist you with questions regarding bills from these companies.  You will be contacted with the lab results as soon as they are available. The fastest way to get your results is to activate your My Chart account. Instructions are located on the last page of this paperwork. If you have not heard from us regarding the results in 2 weeks, please contact this office.

## 2017-09-17 NOTE — Progress Notes (Signed)
[facility] AT [facility] [address], [place] [id number] [phone]  Date:  09/17/2017   Name:  Cristina [name]   DOB:  June 17, 1996   MRN:  [id number]  PCP:  Patient, No Pcp Per    History of Present Illness:  Cristina [name] is a 21 y.o. female patient who presents to PCP with  Chief Complaint  Patient presents with  . Anxiety    x comes and goes since having baby  . Postpartum Care    pt has a 609 month old baby and feels overhelmed at times.     She has some anxiety. Early Saturday morning, had a panic attack or what she states is the first time. She has noted increased anxiety  No si/hi.  No feelings of guilt or worthlessness Rocky relationship with father of child.   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 She had to stop [employer] due to, not having her priorities straight, she reports.  She is now at [employer] with plans to return there next fall.     Patient Active Problem List   Diagnosis Date Noted  . Gestational hypertension, third trimester 12/13/2016  . GBS (group B Streptococcus carrier), +RV culture, currently pregnant 11/27/2016  . Positive GBS test 11/17/2016  . Abnormal fetal ultrasound 10/03/2016  . Supervision of normal first pregnancy, antepartum 07/06/2016  . Late prenatal care starting at [redacted] weeks GA 07/06/2016    Past Medical History:  Diagnosis Date  . Chlamydia infection 08/2015   IUD - Mirena removed 12/2015  . Gonorrhea 08/2015   IUD Mirena removed 12/2015  . Medical history non-contributory     Past Surgical History:  Procedure Laterality Date  . CESAREAN SECTION N/A 12/15/2016   Performed by Tereso NewcomerAnyanwu, Ugonna A, MD at [facility]  . NO PAST SURGERIES      Social History   Tobacco Use  . Smoking status: Never Smoker  . Smokeless tobacco: Never Used  Substance Use Topics  . Alcohol use: No  . Drug use: No    Family History  Problem Relation Age of Onset  . Diabetes Maternal  Grandmother   . Diabetes Maternal Grandfather   . Allergies Neg Hx   . Cancer Neg Hx   . Hyperlipidemia Neg Hx   . Heart disease Neg Hx   . Mental illness Neg Hx   . Mental retardation Neg Hx   . Obesity Neg Hx   . Seizures Neg Hx   . Epilepsy Neg Hx     No Known Allergies  Medication list has been reviewed and updated.  Current Outpatient Medications on File Prior to Visit  Medication Sig Dispense Refill  . levonorgestrel (MIRENA) 20 MCG/24HR IUD 1 each once by Intrauterine route.    . ferrous sulfate 325 (65 FE) MG tablet Take 1 tablet (325 mg total) by mouth 2 (two) times daily with a meal. (Patient not taking: Reported on 09/17/2017) 60 tablet 3  . ibuprofen (ADVIL,MOTRIN) 600 MG tablet Take 1 tablet (600 mg total) by mouth every 6 (six) hours. (Patient not taking: Reported on 09/17/2017) 30 tablet 0  . norethindrone (MICRONOR,CAMILA,ERRIN) 0.35 MG tablet Take 1 tablet (0.35 mg total) by mouth daily. (Patient not taking: Reported on 09/17/2017) 1 Package 11  . oxyCODONE-acetaminophen (PERCOCET/ROXICET) 5-325 MG tablet Take 1 tablet by mouth every 4 (four) hours as needed (pain scale 4-7). (Patient not taking: Reported on 09/17/2017) 30 tablet 0  .  Prenatal MV & Min w/FA-DHA (PRENATAL ADULT GUMMY/DHA/FA) 0.4-25 MG CHEW Chew 2 each by mouth daily.     No current facility-administered medications on file prior to visit.     ROS ROS otherwise unremarkable unless listed above.  Physical Examination: BP 124/76   Pulse 98   Temp 98.1 F (36.7 C) (Oral)   Resp 16   Ht 5\' 5"  (1.651 m)   Wt 221 lb (100.2 kg)   LMP 09/15/2017   SpO2 100%   Breastfeeding? No   BMI 36.78 kg/m  Ideal Body Weight: Weight in (lb) to have BMI = 25: 149.9  Physical Exam  Constitutional: She is oriented to person, place, and time. She appears well-developed and well-nourished. No distress.  HENT:  Head: Normocephalic and atraumatic.  Right Ear: External ear normal.  Left Ear: External ear normal.   Eyes: Conjunctivae and EOM are normal. Pupils are equal, round, and reactive to light.  Cardiovascular: Normal rate.  Pulmonary/Chest: Effort normal. No respiratory distress.  Neurological: She is alert and oriented to person, place, and time.  Skin: She is not diaphoretic.  Psychiatric: She has a normal mood and affect. Her behavior is normal.     Assessment and Plan: Cristina [name] is a 21 y.o. female who is here today for cc of  Chief Complaint  Patient presents with  . Anxiety    x comes and goes since having baby  . Postpartum Care    pt has a 189 month old baby and feels overhelmed at times.   She does not fit the criterial for depression, but has anxiety symptoms.  This may best be suited for counseling.  Given recommendations, and sites.  Advised to return in 5 weeks for recheck.  She may also contact me if she would like to go on to a medication in the interim.  Advised exercise, featuring dance classes. Anxiety state - Plan: CBC, TSH  Trena PlattStephanie English, PA-C [facility] 11/25/20187:10 PM

## 2017-09-18 LAB — TSH: TSH: 1.03 u[IU]/mL (ref 0.450–4.500)

## 2017-09-18 LAB — CBC
HEMATOCRIT: 39.2 % (ref 34.0–46.6)
HEMOGLOBIN: 13 g/dL (ref 11.1–15.9)
MCH: 27.5 pg (ref 26.6–33.0)
MCHC: 33.2 g/dL (ref 31.5–35.7)
MCV: 83 fL (ref 79–97)
Platelets: 361 10*3/uL (ref 150–379)
RBC: 4.72 x10E6/uL (ref 3.77–5.28)
RDW: 15.1 % (ref 12.3–15.4)
WBC: 8 10*3/uL (ref 3.4–10.8)

## 2017-09-22 ENCOUNTER — Encounter: Payer: Self-pay | Admitting: Radiology

## 2018-01-29 ENCOUNTER — Encounter: Payer: Self-pay | Admitting: Physician Assistant

## 2020-05-01 ENCOUNTER — Encounter (HOSPITAL_COMMUNITY): Payer: Self-pay

## 2020-05-01 ENCOUNTER — Ambulatory Visit (INDEPENDENT_AMBULATORY_CARE_PROVIDER_SITE_OTHER): Payer: PRIVATE HEALTH INSURANCE

## 2020-05-01 ENCOUNTER — Ambulatory Visit (HOSPITAL_COMMUNITY)
Admission: EM | Admit: 2020-05-01 | Discharge: 2020-05-01 | Disposition: A | Discharge status: Home / Self Care | Payer: PRIVATE HEALTH INSURANCE | Attending: Urgent Care | Admitting: Urgent Care

## 2020-05-01 ENCOUNTER — Other Ambulatory Visit: Payer: Self-pay

## 2020-05-01 DIAGNOSIS — M25431 Effusion, right wrist: Secondary | ICD-10-CM

## 2020-05-01 DIAGNOSIS — M25531 Pain in right wrist: Secondary | ICD-10-CM

## 2020-05-01 DIAGNOSIS — S6991XA Unspecified injury of right wrist, hand and finger(s), initial encounter: Secondary | ICD-10-CM | POA: Diagnosis not present

## 2020-05-01 DIAGNOSIS — S61309A Unspecified open wound of unspecified finger with damage to nail, initial encounter: Secondary | ICD-10-CM

## 2020-05-01 DIAGNOSIS — S5012XA Contusion of left forearm, initial encounter: Secondary | ICD-10-CM | POA: Diagnosis not present

## 2020-05-01 DIAGNOSIS — M7989 Other specified soft tissue disorders: Secondary | ICD-10-CM

## 2020-05-01 DIAGNOSIS — M79641 Pain in right hand: Secondary | ICD-10-CM

## 2020-05-01 DIAGNOSIS — S61306A Unspecified open wound of right little finger with damage to nail, initial encounter: Secondary | ICD-10-CM

## 2020-05-01 DIAGNOSIS — S60211A Contusion of right wrist, initial encounter: Secondary | ICD-10-CM

## 2020-05-01 DIAGNOSIS — M79632 Pain in left forearm: Secondary | ICD-10-CM

## 2020-05-01 MED ORDER — IBUPROFEN 800 MG PO TABS
800.0000 mg | ORAL_TABLET | Freq: Once | ORAL | Status: AC
  Administered 2020-05-01: 800 mg via ORAL

## 2020-05-01 MED ORDER — TIZANIDINE HCL 4 MG PO TABS: 4.0000 mg | ORAL_TABLET | Freq: Three times a day (TID) | ORAL | 0 refills | Status: DC | PRN

## 2020-05-01 MED ORDER — NAPROXEN 500 MG PO TABS: 500.0000 mg | ORAL_TABLET | Freq: Two times a day (BID) | ORAL | 0 refills | Status: DC

## 2020-05-01 MED ORDER — IBUPROFEN 800 MG PO TABS
ORAL_TABLET | ORAL | Status: AC
  Filled 2020-05-01: qty 1
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
## 2020-09-19 IMAGING — DX DG HAND COMPLETE 3+V*R*
3 series · 3 of 3 positions shown · non-contrast
Comparison: None.

CLINICAL DATA: Right hand injury, swelling.

EXAM:
RIGHT HAND - COMPLETE 3+ VIEW

[hand pa]
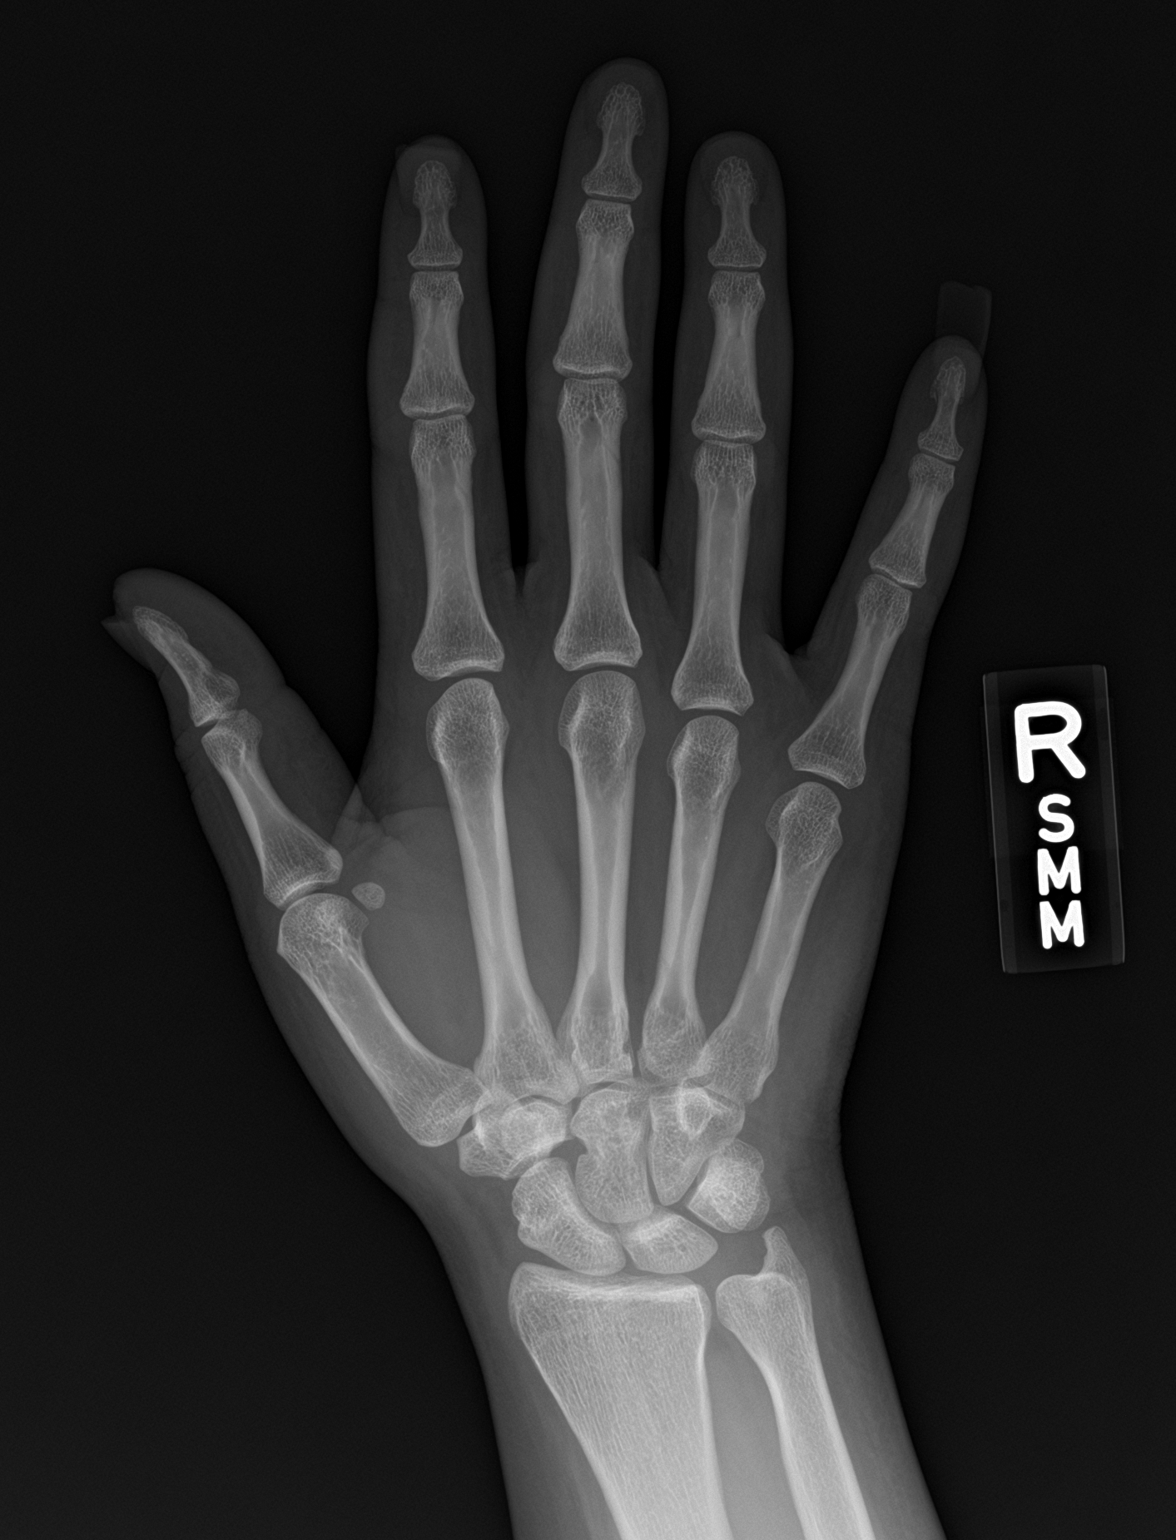

[hand obl]
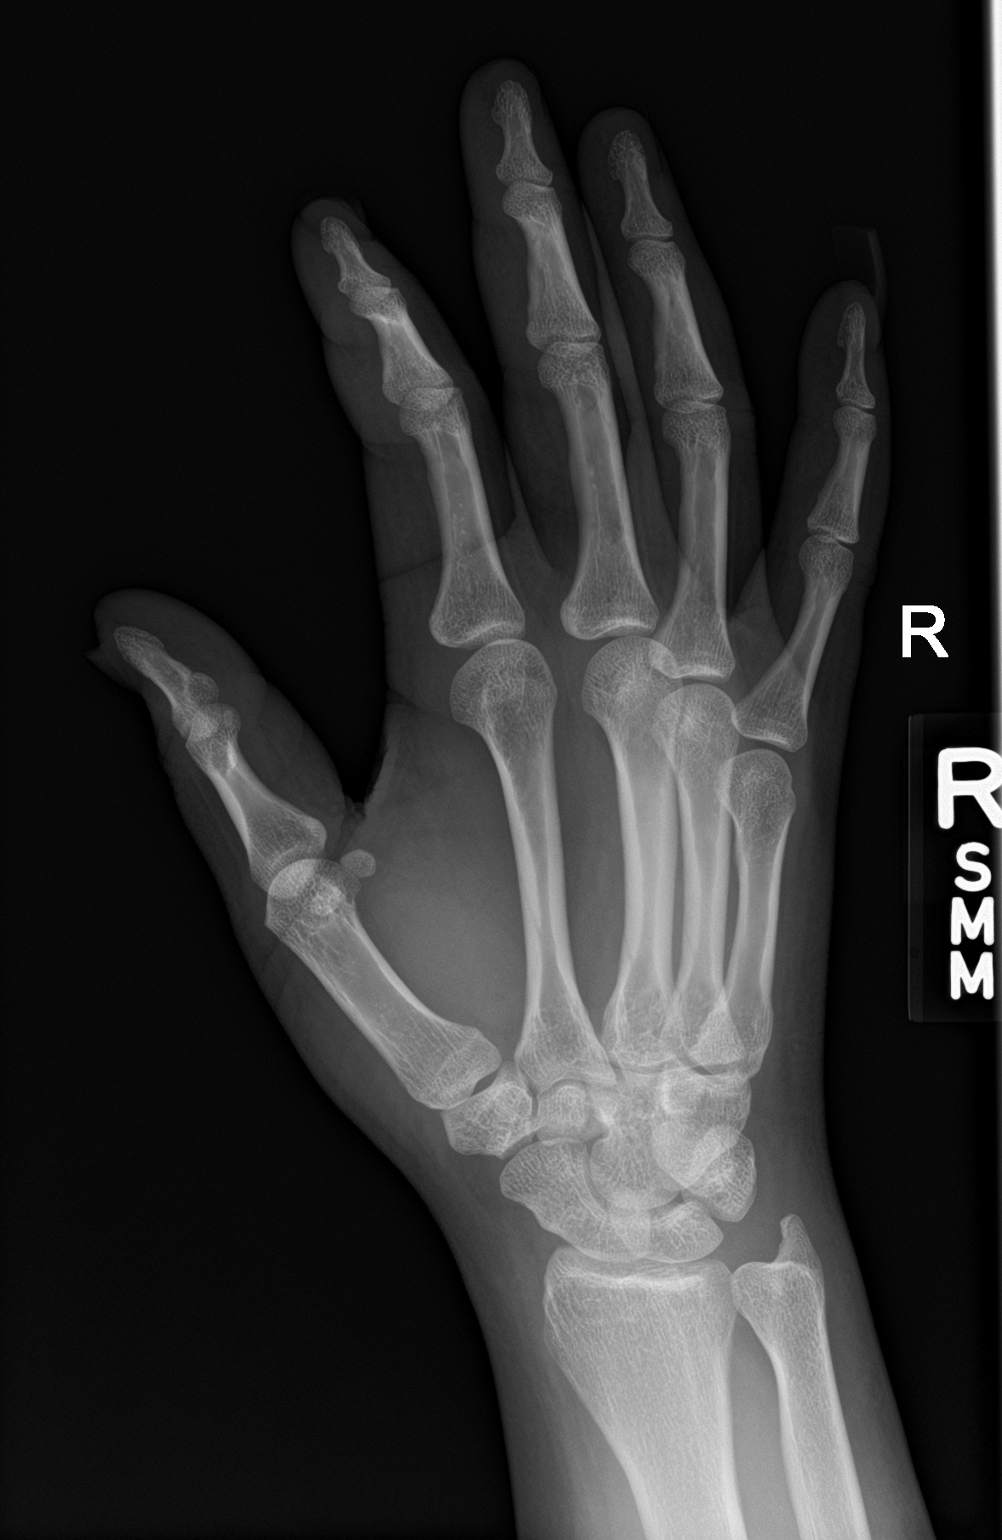

[hand lat]
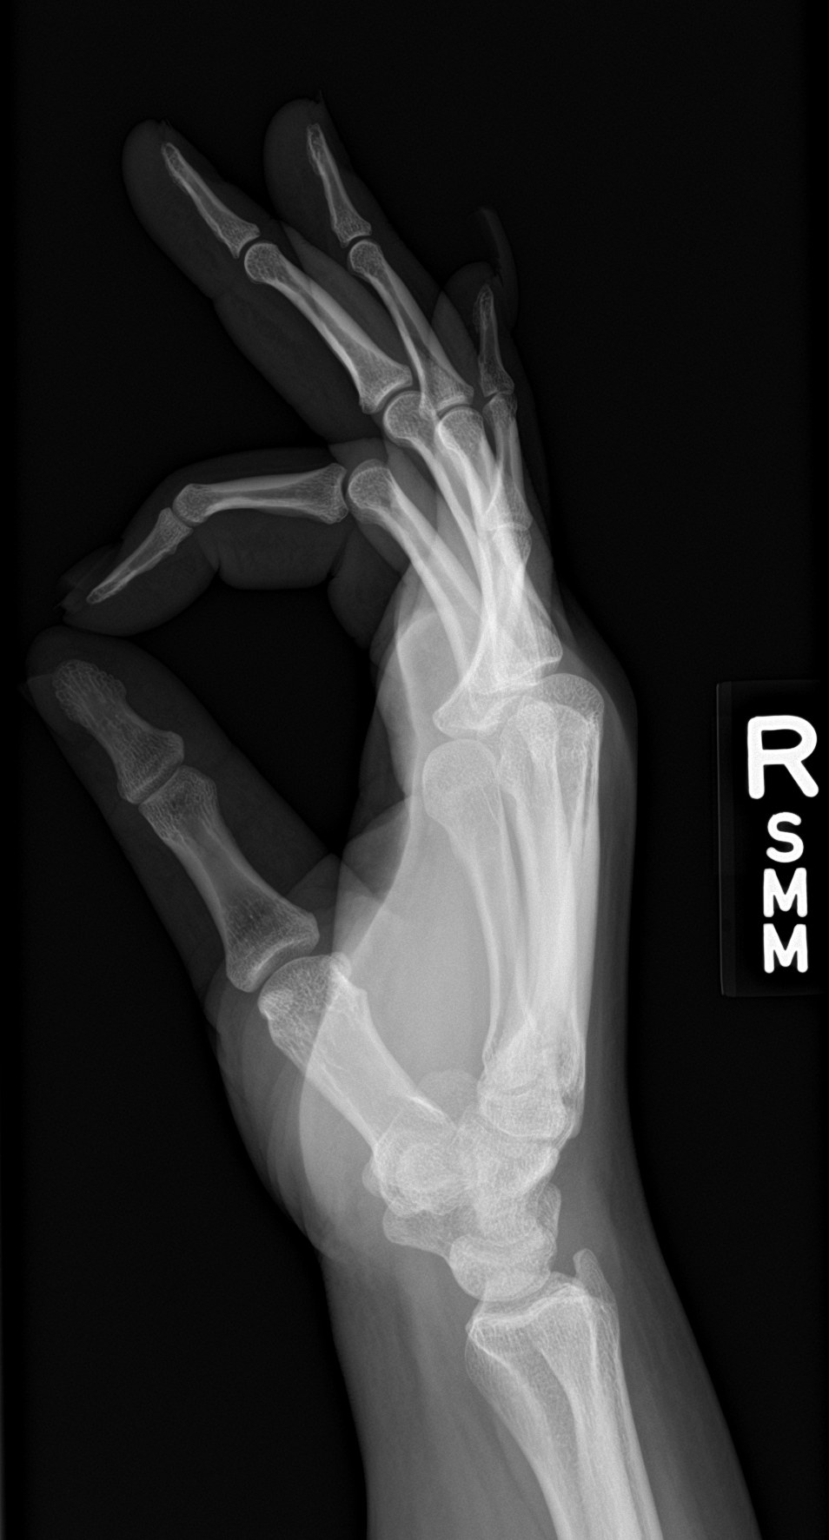

[3 of 3 positions shown; findings below may reference images not displayed]

FINDINGS: There is no evidence of fracture or dislocation. There is no
evidence of arthropathy or other focal bone abnormality. Soft
tissues are unremarkable. No radiopaque foreign body is identified.
IMPRESSION: Negative.

## 2023-07-10 ENCOUNTER — Other Ambulatory Visit: Payer: Self-pay | Admitting: Pharmacy Technician

## 2023-07-10 DIAGNOSIS — E860 Dehydration: Secondary | ICD-10-CM | POA: Insufficient documentation

## 2023-07-10 DIAGNOSIS — R112 Nausea with vomiting, unspecified: Secondary | ICD-10-CM | POA: Insufficient documentation

## 2023-07-18 ENCOUNTER — Emergency Department (HOSPITAL_BASED_OUTPATIENT_CLINIC_OR_DEPARTMENT_OTHER)
Admission: EM | Admit: 2023-07-18 | Discharge: 2023-07-18 | Disposition: A | Discharge status: Home / Self Care | Payer: Managed Care, Other (non HMO) | Attending: Emergency Medicine | Admitting: Emergency Medicine

## 2023-07-18 ENCOUNTER — Encounter (HOSPITAL_BASED_OUTPATIENT_CLINIC_OR_DEPARTMENT_OTHER): Payer: Self-pay | Admitting: Emergency Medicine

## 2023-07-18 ENCOUNTER — Encounter: Payer: Self-pay | Admitting: Family Medicine

## 2023-07-18 ENCOUNTER — Other Ambulatory Visit: Payer: Self-pay

## 2023-07-18 DIAGNOSIS — Y92410 Unspecified street and highway as the place of occurrence of the external cause: Secondary | ICD-10-CM | POA: Diagnosis not present

## 2023-07-18 DIAGNOSIS — M7918 Myalgia, other site: Secondary | ICD-10-CM

## 2023-07-18 DIAGNOSIS — R1010 Upper abdominal pain, unspecified: Secondary | ICD-10-CM | POA: Insufficient documentation
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
## 2023-07-18 NOTE — Discharge Instructions (Signed)
Use heat therapy for comfort. Recommend Ibuprofen used regularly (600 mg which is 3 over-the-counter strength tablets every 6 hours)

## 2023-07-18 NOTE — ED Notes (Signed)
Updated pt on wait times, awaiting provider assessment.

## 2023-08-20 ENCOUNTER — Ambulatory Visit
Admission: EM | Admit: 2023-08-20 | Discharge: 2023-08-20 | Disposition: A | Discharge status: Home / Self Care | Payer: Managed Care, Other (non HMO) | Attending: Internal Medicine | Admitting: Internal Medicine

## 2023-08-20 ENCOUNTER — Encounter: Payer: Self-pay | Admitting: Family Medicine

## 2023-08-20 DIAGNOSIS — J029 Acute pharyngitis, unspecified: Secondary | ICD-10-CM

## 2023-08-20 LAB — POCT RAPID STREP A (OFFICE): Rapid Strep A Screen: NEGATIVE

## 2023-08-20 MED ORDER — AMOXICILLIN 500 MG PO CAPS: 500.0000 mg | ORAL_CAPSULE | Freq: Two times a day (BID) | ORAL | 0 refills | Status: DC

## 2023-08-20 MED ORDER — CETIRIZINE HCL 10 MG PO TABS: 10.0000 mg | ORAL_TABLET | Freq: Every day | ORAL | 0 refills | Status: DC

## 2023-08-20 MED ORDER — FLUCONAZOLE 150 MG PO TABS: 150.0000 mg | ORAL_TABLET | ORAL | 0 refills | Status: DC

## 2023-08-20 MED ORDER — PSEUDOEPHEDRINE HCL 30 MG PO TABS: 30.0000 mg | ORAL_TABLET | Freq: Three times a day (TID) | ORAL | 0 refills | Status: DC | PRN

## 2023-08-20 NOTE — Discharge Instructions (Signed)
Start amoxicillin to address your throat infection. Take this for 10 days. Use ibuprofen for the pain and inflammation as needed. Use Zyrtec and pseudoephedrine for your sinuses. Since antibiotics can cause yeast infection, I would like you to take fluconazole 1 tablet every 3 days to cover the duration you are on the antibiotic.

## 2023-08-20 NOTE — ED Triage Notes (Signed)
Pt reports sore throat, nasal congestion, body aches, fever 102.0 F and nasal congestion x 1 day. Theraflu helps with fever.

## 2023-08-20 NOTE — ED Provider Notes (Signed)
[facility]  Note:  This document was prepared using Conservation officer, historic buildings and may include unintentional dictation errors.  MRN: [id number] DOB: 10/18/1996  Subjective:   Cristina [name] is a 27 y.o. female presenting for 1 day history of throat pain, painful swallowing, body aches, fevers, sinus congestion. No coughing, chest pain, shob, wheezing. No history of asthma. Vapes daily. Has history of frequent strep infections.   No current facility-administered medications for this encounter.  Current Outpatient Medications:    naproxen (NAPROSYN) 500 MG tablet, Take 1 tablet (500 mg total) by mouth 2 (two) times daily with a meal., Disp: 30 tablet, Rfl: 0   tiZANidine (ZANAFLEX) 4 MG tablet, Take 1 tablet (4 mg total) by mouth every 8 (eight) hours as needed., Disp: 30 tablet, Rfl: 0   No Known Allergies  Past Medical History:  Diagnosis Date   Chlamydia infection 08/2015   IUD - Mirena removed 12/2015   Gonorrhea 08/2015   IUD Mirena removed 12/2015   Medical history non-contributory      Past Surgical History:  Procedure Laterality Date   CESAREAN SECTION N/A 12/15/2016   Procedure: CESAREAN SECTION;  Surgeon: Tereso Newcomer, MD;  Location: [facility];  Service: Obstetrics;  Laterality: N/A;   NO PAST SURGERIES      Family History  Problem Relation Age of Onset   Diabetes Maternal Grandmother    Diabetes Maternal Grandfather    Allergies Neg Hx    Cancer Neg Hx    Hyperlipidemia Neg Hx    Heart disease Neg Hx    Mental illness Neg Hx    Mental retardation Neg Hx    Obesity Neg Hx    Seizures Neg Hx    Epilepsy Neg Hx     Social History   Tobacco Use   Smoking status: Former    Types: Cigars   Smokeless tobacco: Never   Tobacco comments:    black and milds  Vaping Use   Vaping status: Every Day  Substance Use Topics   Alcohol use: Yes    Alcohol/week: 3.0 standard drinks of alcohol    Types: 3 Shots of liquor  per week   Drug use: No    ROS   Objective:   Vitals: BP (!) 142/82 (BP Location: Right Arm)   Pulse 86   Temp 99.1 F (37.3 C) (Oral)   Resp 18   SpO2 95%   Physical Exam Constitutional:      General: She is not in acute distress.    Appearance: Normal appearance. She is well-developed and normal weight. She is not ill-appearing, toxic-appearing or diaphoretic.  HENT:     Head: Normocephalic and atraumatic.     Right Ear: Tympanic membrane, ear canal and external ear normal. No drainage or tenderness. No middle ear effusion. There is no impacted cerumen. Tympanic membrane is not erythematous or bulging.     Left Ear: Tympanic membrane, ear canal and external ear normal. No drainage or tenderness.  No middle ear effusion. There is no impacted cerumen. Tympanic membrane is not erythematous or bulging.     Nose: Nose normal. No congestion or rhinorrhea.     Mouth/Throat:     Mouth: Mucous membranes are moist. No oral lesions.     Pharynx: Pharyngeal swelling, oropharyngeal exudate and posterior oropharyngeal erythema present. No uvula swelling.     Tonsils: Tonsillar exudate present. No tonsillar abscesses. 2+ on the right. 2+ on the left.  Eyes:     General: No scleral icterus.       Right eye: No discharge.        Left eye: No discharge.     Extraocular Movements: Extraocular movements intact.     Right eye: Normal extraocular motion.     Left eye: Normal extraocular motion.     Conjunctiva/sclera: Conjunctivae normal.  Cardiovascular:     Rate and Rhythm: Normal rate.  Pulmonary:     Effort: Pulmonary effort is normal.  Musculoskeletal:     Cervical back: Normal range of motion and neck supple.  Lymphadenopathy:     Cervical: No cervical adenopathy.  Skin:    General: Skin is warm and dry.  Neurological:     General: No focal deficit present.     Mental Status: She is alert and oriented to person, place, and time.  Psychiatric:        Mood and Affect: Mood normal.         Behavior: Behavior normal.    Results for orders placed or performed during the hospital encounter of 08/20/23 (from the past 24 hour(s))  POCT rapid strep A     Status: None   Collection Time: 08/20/23  9:18 AM  Result Value Ref Range   Rapid Strep A Screen Negative Negative    Assessment and Plan :   PDMP not reviewed this encounter.  1. Acute pharyngitis, unspecified etiology    Will treat empirically for pharyngitis given physical exam findings.  Patient is to start amoxicillin, use supportive care otherwise. Counseled patient on potential for adverse effects with medications prescribed/recommended today, ER and return-to-clinic precautions discussed, patient verbalized understanding.    Wallis Bamberg, [place] 08/20/23 [phone]

## 2023-11-09 ENCOUNTER — Ambulatory Visit
Admission: EM | Admit: 2023-11-09 | Discharge: 2023-11-09 | Disposition: A | Discharge status: Home / Self Care | Payer: Self-pay | Attending: Family Medicine | Admitting: Family Medicine

## 2023-11-09 ENCOUNTER — Ambulatory Visit: Payer: Self-pay

## 2023-11-09 DIAGNOSIS — J029 Acute pharyngitis, unspecified: Secondary | ICD-10-CM | POA: Insufficient documentation

## 2023-11-09 LAB — POCT RAPID STREP A (OFFICE): Rapid Strep A Screen: NEGATIVE

## 2023-11-09 MED ORDER — AMOXICILLIN 500 MG PO CAPS: 500.0000 mg | ORAL_CAPSULE | Freq: Two times a day (BID) | ORAL | 0 refills | Status: AC

## 2023-11-09 NOTE — Discharge Instructions (Signed)
 Start amoxicillin  twice daily for 10 days.  You may use over-the-counter Tylenol  or ibuprofen  as needed.  Salt water gargles and warm liquids.  Lots of rest and fluids.  Please follow-up with your PCP if your symptoms do not improve.  Please go to the ER for any worsening symptoms.  I hope you feel better soon!

## 2023-11-09 NOTE — ED Triage Notes (Signed)
 Pt presents with c/o possible sinus infection, states she has had a sore throat x 2 days. Pt is going to be starting abx for BV, per her gyn Home interventions: took amoxicillin that was prescribed a while ago.

## 2023-11-09 NOTE — ED Provider Notes (Signed)
 [facility]    CSN: [id number] Arrival date & time: 11/09/23  1613      History   Chief Complaint Chief Complaint  Patient presents with   Sore Throat    HPI Cristina [name] is a 28 y.o. female  presents for evaluation of URI symptoms for 2 days. Patient reports associated symptoms of sore throat postnasal drip with chills. Denies N/V/D, fever, cough, ear pain, body aches, shortness of breath. Patient does not have a hx of asthma.  She has been taking some leftover amoxicillin  that she had from a previous illness.  States she is taken 3 pills total, dosage unknown.  Pt has no other concerns at this time.    Sore Throat    Past Medical History:  Diagnosis Date   Chlamydia infection 08/2015   IUD - Mirena removed 12/2015   Gonorrhea 08/2015   IUD Mirena removed 12/2015   Medical history non-contributory     Patient Active Problem List   Diagnosis Date Noted   Dehydration 07/10/2023   Nausea with vomiting 07/10/2023   Gestational hypertension, third trimester 12/13/2016   GBS (group B Streptococcus carrier), +RV culture, currently pregnant 11/27/2016   Positive GBS test 11/17/2016   Abnormal fetal ultrasound 10/03/2016   Supervision of normal first pregnancy, antepartum 07/06/2016   Late prenatal care starting at [redacted] weeks GA 07/06/2016    Past Surgical History:  Procedure Laterality Date   CESAREAN SECTION N/A 12/15/2016   Procedure: CESAREAN SECTION;  Surgeon: Gloris DELENA Hugger, MD;  [facility];  Service: Obstetrics;  Laterality: N/A;   NO PAST SURGERIES      OB History     Gravida  1   Para  1   Term  1   Preterm  0   AB  0   Living  1      SAB  0   IAB  0   Ectopic  0   Multiple  0   Live Births  1            Home Medications    Prior to Admission medications   Medication Sig Start Date End Date Taking? Authorizing Provider  amoxicillin  (AMOXIL ) 500 MG capsule Take 1 capsule (500 mg total) by mouth 2  (two) times daily for 10 days. 11/09/23 11/19/23 Yes Jovee Dettinger, Jodi R, NP  cetirizine  (ZYRTEC  ALLERGY) 10 MG tablet Take 1 tablet (10 mg total) by mouth daily. 08/20/23   Christopher Savannah, PA-C  fluconazole  (DIFLUCAN ) 150 MG tablet Take 1 tablet (150 mg total) by mouth every 3 (three) days. 08/20/23   Christopher Savannah, PA-C  naproxen  (NAPROSYN ) 500 MG tablet Take 1 tablet (500 mg total) by mouth 2 (two) times daily with a meal. 05/01/20   Christopher Savannah, PA-C  Phenylephrine -Pheniramine-DM [facility] COLD & COUGH PO) Take by mouth.    [provider]  pseudoephedrine  (SUDAFED) 30 MG tablet Take 1 tablet (30 mg total) by mouth every 8 (eight) hours as needed for congestion. 08/20/23   Christopher Savannah, PA-C  tiZANidine  (ZANAFLEX ) 4 MG tablet Take 1 tablet (4 mg total) by mouth every 8 (eight) hours as needed. 05/01/20   Christopher Savannah, PA-C  valACYclovir (VALTREX) 500 MG tablet Take 500 mg by mouth 2 (two) times daily. 06/06/23   [provider]  ferrous sulfate  325 (65 FE) MG tablet Take 1 tablet (325 mg total) by mouth 2 (two) times daily with a meal. Patient not taking: Reported on 09/17/2017  12/19/16 05/01/20  Dove, Myra C, MD  levonorgestrel (MIRENA) 20 MCG/24HR IUD 1 each once by Intrauterine route.  05/01/20  [provider]  norethindrone  (MICRONOR ,CAMILA ,ERRIN ) 0.35 MG tablet Take 1 tablet (0.35 mg total) by mouth daily. Patient not taking: Reported on 09/17/2017 12/19/16 05/01/20  Starla Harland BROCKS, MD    Family History Family History  Problem Relation Age of Onset   Diabetes Maternal Grandmother    Diabetes Maternal Grandfather    Allergies Neg Hx    Cancer Neg Hx    Hyperlipidemia Neg Hx    Heart disease Neg Hx    Mental illness Neg Hx    Mental retardation Neg Hx    Obesity Neg Hx    Seizures Neg Hx    Epilepsy Neg Hx     Social History Social History   Tobacco Use   Smoking status: Former    Types: Cigars   Smokeless tobacco: Never   Tobacco comments:    black and milds  Vaping Use    Vaping status: Every Day  Substance Use Topics   Alcohol use: Yes    Alcohol/week: 3.0 standard drinks of alcohol    Types: 3 Shots of liquor per week   Drug use: No     Allergies   Patient has no known allergies.   Review of Systems Review of Systems  HENT:  Positive for postnasal drip and sore throat.      Physical Exam Triage Vital Signs ED Triage Vitals  Encounter Vitals Group     BP 11/09/23 1627 125/83     Systolic BP Percentile --      Diastolic BP Percentile --      Pulse Rate 11/09/23 1627 95     Resp 11/09/23 1627 17     Temp 11/09/23 1627 98.5 F (36.9 C)     Temp Source 11/09/23 1627 Oral     SpO2 11/09/23 1627 98 %     Weight --      Height --      Head Circumference --      Peak Flow --      Pain Score 11/09/23 1625 5     Pain Loc --      Pain Education --      Exclude from Growth Chart --    No data found.  Updated Vital Signs BP 125/83 (BP Location: Right Arm)   Pulse 95   Temp 98.5 F (36.9 C) (Oral)   Resp 17   LMP 10/19/2023 (Exact Date)   SpO2 98%   Visual Acuity Right Eye Distance:   Left Eye Distance:   Bilateral Distance:    Right Eye Near:   Left Eye Near:    Bilateral Near:     Physical Exam Vitals and nursing note reviewed.  Constitutional:      General: She is not in acute distress.    Appearance: Normal appearance. She is well-developed. She is not ill-appearing.  HENT:     Head: Normocephalic and atraumatic.     Right Ear: Tympanic membrane and ear canal normal.     Left Ear: Tympanic membrane and ear canal normal.     Nose: No congestion.     Mouth/Throat:     Mouth: Mucous membranes are moist.     Pharynx: Oropharynx is clear. Uvula midline. Posterior oropharyngeal erythema and postnasal drip present.     Tonsils: Tonsillar exudate present. No tonsillar abscesses. 3+ on the right. 3+ on the left.  Eyes:     Conjunctiva/sclera: Conjunctivae normal.     Pupils: Pupils are equal, round, and reactive to light.   Cardiovascular:     Rate and Rhythm: Normal rate and regular rhythm.     Heart sounds: Normal heart sounds.  Pulmonary:     Effort: Pulmonary effort is normal.     Breath sounds: Normal breath sounds.  Musculoskeletal:     Cervical back: Normal range of motion and neck supple.  Lymphadenopathy:     Cervical: No cervical adenopathy.  Skin:    General: Skin is warm and dry.  Neurological:     General: No focal deficit present.     Mental Status: She is alert and oriented to person, place, and time.  Psychiatric:        Mood and Affect: Mood normal.        Behavior: Behavior normal.      UC Treatments / Results  Labs (all labs ordered are listed, but only abnormal results are displayed) Labs Reviewed  CULTURE, GROUP A STREP [facility])  POCT RAPID STREP A (OFFICE)    EKG   Radiology No results found.  Procedures Procedures (including critical care time)  Medications Ordered in UC Medications - No data to display  Initial Impression / Assessment and Plan / UC Course  I have reviewed the triage vital signs and the nursing notes.  Pertinent labs & imaging results that were available during my care of the patient were reviewed by me and considered in my medical decision making (see chart for details).     Reviewed exam and symptoms with patient.  No red flags.  Negative rapid strep will culture.  Given presentation will start amoxicillin .  Encourage salt water gargles and warm liquids and OTC analgesics as needed.  PCP follow-up if symptoms do not improve.  ER precautions reviewed. Final Clinical Impressions(s) / UC Diagnoses   Final diagnoses:  Acute pharyngitis, unspecified etiology     Discharge Instructions      Start amoxicillin  twice daily for 10 days.  You may use over-the-counter Tylenol  or ibuprofen  as needed.  Salt water gargles and warm liquids.  Lots of rest and fluids.  Please follow-up with your PCP if your symptoms do not improve.  Please go to the ER  for any worsening symptoms.  I hope you feel better soon!    ED Prescriptions     Medication Sig Dispense Auth. Provider   amoxicillin  (AMOXIL ) 500 MG capsule Take 1 capsule (500 mg total) by mouth 2 (two) times daily for 10 days. 20 capsule Emilea Goga, Jodi R, NP      PDMP not reviewed this encounter.   Loreda Myla SAUNDERS, NP 11/09/23 [phone]

## 2023-11-13 LAB — CULTURE, GROUP A STREP (THRC)

## 2023-11-20 ENCOUNTER — Encounter: Payer: Self-pay | Admitting: Family Medicine

## 2023-11-20 ENCOUNTER — Ambulatory Visit
Admission: EM | Admit: 2023-11-20 | Discharge: 2023-11-20 | Disposition: A | Discharge status: Home / Self Care | Payer: Self-pay | Attending: Family Medicine | Admitting: Family Medicine

## 2023-11-20 DIAGNOSIS — Z87440 Personal history of urinary (tract) infections: Secondary | ICD-10-CM | POA: Insufficient documentation

## 2023-11-20 DIAGNOSIS — R319 Hematuria, unspecified: Secondary | ICD-10-CM | POA: Insufficient documentation

## 2023-11-20 DIAGNOSIS — N390 Urinary tract infection, site not specified: Secondary | ICD-10-CM

## 2023-11-20 DIAGNOSIS — R8290 Unspecified abnormal findings in urine: Secondary | ICD-10-CM

## 2023-11-20 DIAGNOSIS — R350 Frequency of micturition: Secondary | ICD-10-CM | POA: Insufficient documentation

## 2023-11-20 LAB — POCT URINALYSIS DIP (MANUAL ENTRY)
Bilirubin, UA: NEGATIVE
Glucose, UA: NEGATIVE mg/dL
Ketones, POC UA: NEGATIVE mg/dL
Nitrite, UA: NEGATIVE
Protein Ur, POC: 30 mg/dL — AB
Spec Grav, UA: 1.02 (ref 1.010–1.025)
Urobilinogen, UA: 0.2 U/dL
pH, UA: 6 (ref 5.0–8.0)

## 2023-11-20 LAB — POCT URINE PREGNANCY: Preg Test, Ur: NEGATIVE

## 2023-11-20 MED ORDER — NITROFURANTOIN MONOHYD MACRO 100 MG PO CAPS: 100.0000 mg | ORAL_CAPSULE | Freq: Two times a day (BID) | ORAL | 0 refills | Status: DC

## 2023-11-20 NOTE — ED Provider Notes (Signed)
[facility]    CSN: [id number] Arrival date & time: 11/20/23  1229      History   Chief Complaint Chief Complaint  Patient presents with   Hematuria   Urinary Frequency    HPI Cristina [name] is a 28 y.o. female.   The history is provided by the patient.  Hematuria Pertinent negatives include no headaches.  Urinary Frequency Pertinent negatives include no headaches.  Lower abdominal pressure and frequency abrupt onset today has noted cloudy urine for several days, noted some blood today.  Denies dysuria, urgency, vaginal discharge, irregular vaginal bleeding, concern for STI, fever, chills, sweats, back pain, nausea, vomiting, had a UTI last month.  Past Medical History:  Diagnosis Date   Chlamydia infection 08/2015   IUD - Mirena removed 12/2015   Gonorrhea 08/2015   IUD Mirena removed 12/2015   Medical history non-contributory     Patient Active Problem List   Diagnosis Date Noted   Dehydration 07/10/2023   Nausea with vomiting 07/10/2023   Gestational hypertension, third trimester 12/13/2016   GBS (group B Streptococcus carrier), +RV culture, currently pregnant 11/27/2016   Positive GBS test 11/17/2016   Abnormal fetal ultrasound 10/03/2016   Supervision of normal first pregnancy, antepartum 07/06/2016   Late prenatal care starting at [redacted] weeks GA 07/06/2016    Past Surgical History:  Procedure Laterality Date   CESAREAN SECTION N/A 12/15/2016   Procedure: CESAREAN SECTION;  Surgeon: Tereso Newcomer, MD;  [facility]: [facility];  Service: Obstetrics;  Laterality: N/A;   NO PAST SURGERIES      OB History     Gravida  1   Para  1   Term  1   Preterm  0   AB  0   Living  1      SAB  0   IAB  0   Ectopic  0   Multiple  0   Live Births  1            Home Medications    Prior to Admission medications   Medication Sig Start Date End Date Taking? Authorizing Provider  cetirizine (ZYRTEC ALLERGY) 10 MG tablet Take 1  tablet (10 mg total) by mouth daily. 08/20/23   Wallis Bamberg, PA-C  fluconazole (DIFLUCAN) 150 MG tablet Take 1 tablet (150 mg total) by mouth every 3 (three) days. 08/20/23   Wallis Bamberg, PA-C  naproxen (NAPROSYN) 500 MG tablet Take 1 tablet (500 mg total) by mouth 2 (two) times daily with a meal. 05/01/20   Wallis Bamberg, PA-C  Phenylephrine-Pheniramine-DM [facility] COLD & COUGH PO) Take by mouth.    [provider]  pseudoephedrine (SUDAFED) 30 MG tablet Take 1 tablet (30 mg total) by mouth every 8 (eight) hours as needed for congestion. 08/20/23   Wallis Bamberg, PA-C  tiZANidine (ZANAFLEX) 4 MG tablet Take 1 tablet (4 mg total) by mouth every 8 (eight) hours as needed. 05/01/20   Wallis Bamberg, PA-C  valACYclovir (VALTREX) 500 MG tablet Take 500 mg by mouth 2 (two) times daily. 06/06/23   [provider]  ferrous sulfate 325 (65 FE) MG tablet Take 1 tablet (325 mg total) by mouth 2 (two) times daily with a meal. Patient not taking: Reported on 09/17/2017 12/19/16 05/01/20  Allie Bossier, MD  levonorgestrel (MIRENA) 20 MCG/24HR IUD 1 each once by Intrauterine route.  05/01/20  [provider]  norethindrone (MICRONOR,CAMILA,ERRIN) 0.35 MG tablet Take 1 tablet (0.35 mg total) by  mouth daily. Patient not taking: Reported on 09/17/2017 12/19/16 05/01/20  Allie Bossier, MD    Family History Family History  Problem Relation Age of Onset   Diabetes Maternal Grandmother    Diabetes Maternal Grandfather    Allergies Neg Hx    Cancer Neg Hx    Hyperlipidemia Neg Hx    Heart disease Neg Hx    Mental illness Neg Hx    Mental retardation Neg Hx    Obesity Neg Hx    Seizures Neg Hx    Epilepsy Neg Hx     Social History Social History   Tobacco Use   Smoking status: Former    Types: Cigars   Smokeless tobacco: Never   Tobacco comments:    black and milds  Vaping Use   Vaping status: Every Day  Substance Use Topics   Alcohol use: Yes    Alcohol/week: 3.0 standard drinks of alcohol     Types: 3 Shots of liquor per week   Drug use: No     Allergies   Patient has no known allergies.   Review of Systems Review of Systems  Constitutional:  Negative for appetite change, chills, diaphoresis and fatigue.  Genitourinary:  Positive for frequency and hematuria. Negative for difficulty urinating, dysuria, flank pain, urgency, vaginal bleeding and vaginal discharge.  Skin:  Negative for rash.  Neurological:  Negative for headaches.     Physical Exam Triage Vital Signs ED Triage Vitals  Encounter Vitals Group     BP 11/20/23 1250 114/79     Systolic BP Percentile --      Diastolic BP Percentile --      Pulse Rate 11/20/23 1250 71     Resp 11/20/23 1250 17     Temp 11/20/23 1250 97.9 F (36.6 C)     Temp Source 11/20/23 1250 Oral     SpO2 11/20/23 1250 96 %     Weight 11/20/23 1248 239 lb (108.4 kg)     Height 11/20/23 1248 5\' 5"  (1.651 m)     Head Circumference --      Peak Flow --      Pain Score 11/20/23 1248 0     Pain Loc --      Pain Education --      Exclude from Growth Chart --    No data found.  Updated Vital Signs BP 114/79 (BP Location: Right Arm)   Pulse 71   Temp 97.9 F (36.6 C) (Oral)   Resp 17   Ht 5\' 5"  (1.651 m)   Wt 239 lb (108.4 kg)   LMP 10/19/2023 (Exact Date)   SpO2 96%   BMI 39.77 kg/m   Visual Acuity Right Eye Distance:   Left Eye Distance:   Bilateral Distance:    Right Eye Near:   Left Eye Near:    Bilateral Near:     Physical Exam Vitals and nursing note reviewed.  Constitutional:      Appearance: She is not ill-appearing.  HENT:     Head: Normocephalic.     Mouth/Throat:     Mouth: Mucous membranes are moist.  Eyes:     Conjunctiva/sclera: Conjunctivae normal.  Cardiovascular:     Rate and Rhythm: Normal rate and regular rhythm.     Heart sounds: Normal heart sounds.  Pulmonary:     Effort: Pulmonary effort is normal. No respiratory distress.     Breath sounds: Normal breath sounds. No wheezing,  rhonchi or rales.  Musculoskeletal:     Cervical back: Neck supple.  Lymphadenopathy:     Cervical: No cervical adenopathy.  Skin:    General: Skin is warm and dry.  Neurological:     Mental Status: She is alert and oriented to person, place, and time.      UC Treatments / Results  Labs (all labs ordered are listed, but only abnormal results are displayed) Labs Reviewed  POCT URINALYSIS DIP (MANUAL ENTRY) - Abnormal; Notable for the following components:      Result Value   Clarity, UA cloudy (*)    Blood, UA large (*)    Protein Ur, POC =30 (*)    Leukocytes, UA Large (3+) (*)    All other components within normal limits  URINE CULTURE  POCT URINE PREGNANCY    EKG   Radiology No results found.  Procedures Procedures (including critical care time)  Medications Ordered in UC Medications - No data to display  Initial Impression / Assessment and Plan / UC Course  I have reviewed the triage vital signs and the nursing notes.  Pertinent labs & imaging results that were available during my care of the patient were reviewed by me and considered in my medical decision making (see chart for details).     Well-appearing 28 year old with symptoms of UTI onset today, this cloudy with large blood and large leuks, point-of-care pregnancy is negative.  Will treat for UTI with Macrobid will send culture due to recent infection 1 month ago, home care warning signs and follow-up reviewed with patient Final Clinical Impressions(s) / UC Diagnoses   Final diagnoses:  Cloudy urine   Discharge Instructions   None    ED Prescriptions   None    PDMP not reviewed this encounter.   Meliton Rattan, [place] 11/20/23 1316

## 2023-11-20 NOTE — Discharge Instructions (Signed)
Test results will be released to your MyChart account We will contact you if anything is positive and requires treatment.  Increase fluid intake. follow-up for failure symptoms to improve in 3 days or development of new symptoms such as fever, back pain, vomiting

## 2023-11-20 NOTE — ED Triage Notes (Signed)
Pt presents with complaints of "UTI feeling pressure when peeing" and blood in urine since this AM. Pt states she did visit her OB-GYN in early December, diagnosed with UTI but is unsure if she finished the full course of antibiotics. Pt currently denies pain, just stating discomfort. AZO tablet taken today with improvement.

## 2023-11-22 ENCOUNTER — Telehealth (HOSPITAL_COMMUNITY): Payer: Self-pay

## 2023-11-22 LAB — URINE CULTURE: Culture: >=100000 — AB

## 2023-11-22 MED ORDER — SULFAMETHOXAZOLE-TRIMETHOPRIM 800-160 MG PO TABS: 1.0000 | ORAL_TABLET | Freq: Two times a day (BID) | ORAL | 0 refills | Status: AC

## 2023-11-22 NOTE — Telephone Encounter (Signed)
 Per protocol, pt to dc Macrobid and begin treatment with Bactrim.  Reviewed with patient, verified pharmacy, prescription sent.

## 2024-07-12 ENCOUNTER — Ambulatory Visit
Admission: EM | Admit: 2024-07-12 | Discharge: 2024-07-12 | Disposition: A | Discharge status: Home / Self Care | Attending: Family Medicine | Admitting: Family Medicine

## 2024-07-12 DIAGNOSIS — T192XXA Foreign body in vulva and vagina, initial encounter: Secondary | ICD-10-CM | POA: Diagnosis not present

## 2024-07-12 NOTE — ED Triage Notes (Signed)
 Pt reports she was not able to find the condom last night after sexual intercourse she ands he partner tried to check if it was in the vagina and were not able to find it. Denies pain, discomfort, vaginal discharge.

## 2024-07-12 NOTE — ED Provider Notes (Addendum)
 [facility]  Note:  This document was prepared using Conservation officer, historic buildings and may include unintentional dictation errors.  MRN: [id number] DOB: [birth year]  Subjective:   Cristina [name] is a 28 y.o. female presenting for concerns for a retained condom. Last night, patient had sex and her partner used a condom.  Following sex, they could not find where it was.  Today would like to be evaluated for this.  Denies fever, n/v, abdominal pain, pelvic pain, rashes, dysuria, urinary frequency, hematuria, vaginal discharge.    No current facility-administered medications for this encounter.  Current Outpatient Medications:    nitrofurantoin , macrocrystal-monohydrate, (MACROBID ) 100 MG capsule, Take 1 capsule (100 mg total) by mouth 2 (two) times daily., Disp: 10 capsule, Rfl: 0   valACYclovir (VALTREX) 500 MG tablet, Take 500 mg by mouth 2 (two) times daily., Disp: , Rfl:    No Known Allergies  Past Medical History:  Diagnosis Date   Chlamydia infection 08/2015   IUD - Mirena removed 12/2015   Gonorrhea 08/2015   IUD Mirena removed 12/2015   Medical history non-contributory      Past Surgical History:  Procedure Laterality Date   CESAREAN SECTION N/A 12/15/2016   Procedure: CESAREAN SECTION;  Surgeon: Gloris DELENA Hugger, MD;  [facility];  Service: Obstetrics;  Laterality: N/A;   NO PAST SURGERIES      Family History  Problem Relation Age of Onset   Diabetes Maternal Grandmother    Diabetes Maternal Grandfather    Allergies Neg Hx    Cancer Neg Hx    Hyperlipidemia Neg Hx    Heart disease Neg Hx    Mental illness Neg Hx    Mental retardation Neg Hx    Obesity Neg Hx    Seizures Neg Hx    Epilepsy Neg Hx     Social History   Tobacco Use   Smoking status: Former    Types: Cigars   Smokeless tobacco: Never   Tobacco comments:    black and milds  Vaping Use   Vaping status: Every Day  Substance Use Topics   Alcohol use:  Yes    Alcohol/week: 3.0 standard drinks of alcohol    Types: 3 Shots of liquor per week   Drug use: No    ROS   Objective:   Vitals: BP 120/80 (BP Location: Right Arm)   Pulse 76   Temp 98.7 F (37.1 C) (Oral)   Resp 16   LMP 07/02/2024 (Exact Date)   SpO2 97%   Physical Exam Exam conducted with a chaperone present (CMA Mariela).  Constitutional:      General: She is not in acute distress.    Appearance: Normal appearance. She is well-developed. She is not ill-appearing, toxic-appearing or diaphoretic.  HENT:     Head: Normocephalic and atraumatic.     Nose: Nose normal.     Mouth/Throat:     Mouth: Mucous membranes are moist.  Eyes:     General: No scleral icterus.       Right eye: No discharge.        Left eye: No discharge.     Extraocular Movements: Extraocular movements intact.  Cardiovascular:     Rate and Rhythm: Normal rate.  Pulmonary:     Effort: Pulmonary effort is normal.  Genitourinary:    Labia:        Right: No rash, tenderness, lesion or injury.  Left: No rash, tenderness, lesion or injury.      Vagina: No signs of injury and foreign body. No vaginal discharge, erythema, tenderness, bleeding, lesions or prolapsed vaginal walls.     Cervix: No cervical motion tenderness, discharge, friability, lesion, erythema, cervical bleeding or eversion.     Uterus: Not deviated, not enlarged, not fixed, not tender and no uterine prolapse.      Adnexa:        Right: No mass, tenderness or fullness.         Left: No mass, tenderness or fullness.       Comments: Obvious foreign body revealed to be a cognitive was removed in its entirety using vaginal forceps. Skin:    General: Skin is warm and dry.  Neurological:     General: No focal deficit present.     Mental Status: She is alert and oriented to person, place, and time.  Psychiatric:        Mood and Affect: Mood normal.        Behavior: Behavior normal.     Assessment and Plan :   PDMP not  reviewed this encounter.  1. Vaginal foreign body, initial encounter    Anticipatory guidance provided.   Christopher Savannah, PA-C 07/12/24 1120    Christopher Savannah, [place] 07/12/24 1121

## 2024-09-23 ENCOUNTER — Ambulatory Visit (INDEPENDENT_AMBULATORY_CARE_PROVIDER_SITE_OTHER)

## 2024-09-23 VITALS — BP 118/81 | HR 70 | Temp 99.1°F | Ht 65.0 in | Wt 235.0 lb

## 2024-09-23 DIAGNOSIS — R0683 Snoring: Secondary | ICD-10-CM

## 2024-09-23 DIAGNOSIS — J301 Allergic rhinitis due to pollen: Secondary | ICD-10-CM

## 2024-09-23 DIAGNOSIS — Z6839 Body mass index (BMI) 39.0-39.9, adult: Secondary | ICD-10-CM | POA: Diagnosis not present

## 2024-09-23 DIAGNOSIS — E66812 Obesity, class 2: Secondary | ICD-10-CM | POA: Diagnosis not present

## 2024-09-23 NOTE — Patient Instructions (Signed)
  VISIT SUMMARY: Today, we discussed your sleep issues, weight management, and seasonal allergies. You have been experiencing trouble sleeping, snoring, and occasional dry mouth. We also talked about your weight loss progress and your seasonal allergies.  YOUR PLAN: -SUSPECTED OBSTRUCTIVE SLEEP APNEA: Obstructive sleep apnea is a condition where your breathing stops and starts during sleep due to blocked airways. We have ordered a sleep study to confirm the diagnosis. If confirmed, potential treatments include using a CPAP machine or dental devices. Continuing your weight loss efforts is also important.  -OBESITY, CLASS 2: Class 2 obesity means having a body mass index (BMI) of 35-39.9. You have made progress by losing 15 pounds, but dietary habits need improvement. We referred you to a weight loss center for additional dietary support and encourage you to continue your exercise routine and make healthier food choices.  -ALLERGIC RHINITIS DUE TO POLLEN: Allergic rhinitis is an allergic reaction that causes sneezing, congestion, and a runny nose. We recommend using a saline nasal spray as needed and continuing with Flonase: two sprays in each nostril for seven days, then one spray daily during allergy season.  INSTRUCTIONS: Please follow up after your sleep study to discuss the results and next steps. Continue with your weight loss efforts and follow the recommendations for managing your allergies.                      Contains text generated by [employer]bridge.                                 Contains text generated by [employer]bridge.

## 2024-09-23 NOTE — Progress Notes (Signed)
 Pulmonology Office Visit   Subjective:  Patient ID: Cristina [name], female    DOB: 1996-05-02  MRN: [id number]  Referred by: Rosalea Rosina SAILOR, PA  CC:  Chief Complaint  Patient presents with   Consult    Pt has never had sleep study done before. Pt states she believes she has a little bit of insomnia and snores. Pt states she has trouble waking up throughout the night sometimes and states she feels tired throughout the day.     HPI Cristina [name] is a 28 y.o. female with gestational hypertension and anxiety presents for evaluation of OSA.  Respective notes from provider reviewed as appropriate to gather relevant information for patient care.   Discussed the use of AI scribe software for clinical note transcription with the patient, who gave verbal consent to proceed.  History of Present Illness   Cristina [name] is a 28 year old female who presents for evaluation of obstructive sleep apnea. She was referred by her primary care provider for evaluation of sleep apnea.  She experiences trouble sleeping at night, attributing it to her mind being 'constantly on go.' Snoring has been noted by others over the past couple of years. She has not been told that she stops breathing during sleep, nor does she wake up gasping or choking. She sometimes experiences dry mouth in the morning and occasionally sleeps with her mouth open, leading to drooling.  She experiences restless legs, particularly on days when her anxiety is heightened or after a long day. This sensation is limited to her legs and occurs from time to time, but she does not find it particularly bothersome. Once in bed and not distracted by television or her phone, she can fall asleep quickly, usually around 11:30 PM to midnight. She typically does not wake up during the night and rises at 5:50 AM to ensure her son is getting ready for school, officially getting out of bed by 6:20 AM.  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 She does not take naps during the day due to her work schedule, except occasionally on her lunch break if she is particularly tired. She drinks one to two cups of coffee in the morning and consumes alcohol about once a week, typically in the evening, but does not find it affects her sleep.  She has been actively working on weight loss for the past year, having lost 15 pounds, going from 257 to 235 pounds. She exercises regularly, going to the gym four times a week, but struggles with maintaining healthy eating habits.      PRIOR TESTS and IMAGING: None on file.      09/23/2024    1:00 PM  Results of the Epworth flowsheet  Sitting and reading 1  Watching TV 2  Sitting, inactive in a public place (e.g. a theatre or a meeting) 0  As a passenger in a car for an hour without a break 0  Lying down to rest in the afternoon when circumstances permit 1  Sitting and talking to someone 0  Sitting quietly after a lunch without alcohol 2  In a car, while stopped for a few minutes in traffic 0  Total score 6    Allergies: Pollen extract  Current Outpatient Medications:    citalopram (CELEXA) 10 MG tablet, Take 10 mg by mouth daily., Disp: , Rfl:    ibuprofen  (ADVIL ) 200  MG tablet, Take 200 mg by mouth every 6 (six) hours as needed., Disp: , Rfl:    ipratropium (ATROVENT ) 0.03 % nasal spray, Place 2 sprays into both nostrils as needed., Disp: , Rfl:    valACYclovir (VALTREX) 500 MG tablet, Take 500 mg by mouth 2 (two) times daily., Disp: , Rfl:    cetirizine  (ZYRTEC ) 10 MG tablet, Take 1 tablet by mouth daily., Disp: , Rfl:  Past Medical History:  Diagnosis Date   Chlamydia infection 08/2015   IUD - Mirena removed 12/2015   Gonorrhea 08/2015   IUD Mirena removed 12/2015   Medical history non-contributory    Past Surgical History:  Procedure Laterality Date   CESAREAN SECTION N/A 12/15/2016   Procedure: CESAREAN SECTION;  Surgeon: Gloris DELENA Hugger, MD;  Location: [facility];  Service: Obstetrics;  Laterality: N/A;   NO PAST SURGERIES     Family History  Problem Relation Age of Onset   Diabetes Maternal Grandmother    Diabetes Maternal Grandfather    Allergies Neg Hx    Cancer Neg Hx    Hyperlipidemia Neg Hx    Heart disease Neg Hx    Mental illness Neg Hx    Mental retardation Neg Hx    Obesity Neg Hx    Seizures Neg Hx    Epilepsy Neg Hx    Social History   Socioeconomic History   Marital status: Single    Spouse name: n/a   Number of children: 0   Years of education: Not on file   Highest education level: Not on file  Occupational History   Occupation: student    Comment: [employer]  Tobacco Use   Smoking status: Former    Types: Cigars   Smokeless tobacco: Never   Tobacco comments:    Smoked back in high school, for one year  Vaping Use   Vaping status: Every Day  Substance and Sexual Activity   Alcohol use: Yes    Alcohol/week: 3.0 standard drinks of alcohol    Types: 3 Shots of liquor per week   Drug use: No   Sexual activity: Yes    Birth control/protection: Condom  Other Topics Concern   Not on file  Social History Narrative   Lives with mom.  Brother is away at college.  Sees father weekly.   Social Drivers of Corporate Investment Banker Strain: Not on file  Food Insecurity: Not on file  Transportation Needs: Not on file  Physical Activity: Not on file  Stress: Not on file  Social Connections: Not on file  Intimate Partner Violence: Not on file       Objective:  BP 118/81   Pulse 70   Temp 99.1 F (37.3 C)   Ht 5' 5 (1.651 m) Comment: Per pt  Wt 235 lb (106.6 kg)   SpO2 97% Comment: RA  BMI 39.11 kg/m  BMI Readings from Last 3 Encounters:  09/23/24 39.11 kg/m  11/20/23 39.77 kg/m  07/18/23 38.12 kg/m    Physical Exam: Physical Exam   MEASUREMENTS: Weight- 235. ENT: Normal mucosa. No hypertrophy of inferior turbinates. Tonsils are normal sized. Modified  Mallampati score is normal. Oral cavity normal. PULMONARY: Lungs clear to auscultation bilaterally, no adventitious breath sounds. CARDIOVASCULAR: Regular rate and rhythm, S1 S2 normal, no murmurs. ABDOMEN: Abdomen soft, nontender. Bowel sounds are normal. EXTREMITIES: No peripheral edema noted.       Diagnostic Review:  Last metabolic panel Lab Results  Component Value  Date   GLUCOSE 118 (H) 12/13/2016   NA 133 (L) 12/13/2016   K 4.6 12/13/2016   CL 103 12/13/2016   CO2 22 12/13/2016   BUN 7 12/13/2016   CREATININE 0.64 12/13/2016   GFRNONAA >60 12/13/2016   CALCIUM 8.9 12/13/2016   PROT 7.0 12/13/2016   ALBUMIN 2.9 (L) 12/13/2016   BILITOT 0.5 12/13/2016   ALKPHOS 233 (H) 12/13/2016   AST 19 12/13/2016   ALT 9 (L) 12/13/2016   ANIONGAP 8 12/13/2016         Assessment & Plan:   Assessment & Plan Snoring  Orders:   Home sleep test; Future  Obesity, class 2  Orders:   Amb Ref to Medical Weight Management  Allergic rhinitis due to pollen, unspecified seasonality        Assessment and Plan I discussed with the patient the pathophysiology of obstructive sleep apnea, its association with weight, and its negative effects on hypertension, diabetes, mental health, A-fib, stroke if left untreated.  I briefly discussed the treatment options for obstructive sleep apnea    Suspected obstructive sleep apnea - Ordered sleep study to confirm diagnosis. - Discussed potential treatments including CPAP and dental devices if confirmed. Will prescribe CPAP if OSA confirmed.  - Advised on weight loss importance.  Obesity, class 2 Class 2 obesity with weight reduction from 257 lbs to 235 lbs. Exercise routine established, dietary habits need improvement. - Referred to weight loss center for dietary support. - Encouraged continued weight loss through exercise and dietary changes.  Allergic rhinitis due to pollen Seasonal allergic rhinitis with mild nasal congestion. -  Advised saline nasal spray as needed. - Recommended continued Flonase use: two sprays each nostril for seven days, then one spray daily during allergy season.       She was counselled about not driving while drowsy which is common side effect of sleep related disorders.   Return for 2 months after sleep study.   I personally spent a total of 25 minutes in the care of the patient today including preparing to see the patient, getting/reviewing separately obtained history, performing a medically appropriate exam/evaluation, counseling and educating, placing orders, documenting clinical information in the EHR, independently interpreting results, and communicating results.   Tita Terhaar, MD

## 2024-10-16 NOTE — Progress Notes (Unsigned)
 [facility] Telephone:(336) [phone]   Fax:([phone]) [phone]  INITIAL CONSULT NOTE  Patient Care Team: Patient, No Pcp Per as PCP - General (General Practice)  Hematological/Oncological History # ***  CHIEF COMPLAINTS/PURPOSE OF CONSULTATION:  ***   HISTORY OF PRESENTING ILLNESS:  Cristina [name] 28 y.o. female with medical history significant for ***  On review of the previous records ***  On exam today ***  MEDICAL HISTORY:  Past Medical History:  Diagnosis Date   Chlamydia infection 08/2015   IUD - Mirena removed 12/2015   Gonorrhea 08/2015   IUD Mirena removed 12/2015   Medical history non-contributory     SURGICAL HISTORY: Past Surgical History:  Procedure Laterality Date   CESAREAN SECTION N/A 12/15/2016   Procedure: CESAREAN SECTION;  Surgeon: Gloris DELENA Hugger, MD;  Location: [facility];  Service: Obstetrics;  Laterality: N/A;   NO PAST SURGERIES      SOCIAL HISTORY: Social History   Socioeconomic History   Marital status: Single    Spouse name: n/a   Number of children: 0   Years of education: Not on file   Highest education level: Not on file  Occupational History   Occupation: student    Comment: [employer]  Tobacco Use   Smoking status: Former    Types: Cigars   Smokeless tobacco: Never   Tobacco comments:    Smoked back in high school, for one year  Vaping Use   Vaping status: Every Day  Substance and Sexual Activity   Alcohol use: Yes    Alcohol/week: 3.0 standard drinks of alcohol    Types: 3 Shots of liquor per week   Drug use: No   Sexual activity: Yes    Birth control/protection: Condom  Other Topics Concern   Not on file  Social History Narrative   Lives with mom.  Brother is away at college.  Sees father weekly.   Social Drivers of Health   Tobacco Use: Medium Risk (09/23/2024)   Patient History    Smoking Tobacco Use: Former    Smokeless Tobacco Use: Never    Passive Exposure: Not on Surveyor, Minerals Strain: Not on file  Food Insecurity: Not on file  Transportation Needs: Not on file  Physical Activity: Not on file  Stress: Not on file  Social Connections: Not on file  Intimate Partner Violence: Not on file  Depression (PHQ2-9): Not on file  Alcohol Screen: Not on file  Housing: Not on file  Utilities: Not on file  Health Literacy: Not on file    FAMILY HISTORY: Family History  Problem Relation Age of Onset   Diabetes Maternal Grandmother    Diabetes Maternal Grandfather    Allergies Neg Hx    Cancer Neg Hx    Hyperlipidemia Neg Hx    Heart disease Neg Hx    Mental illness Neg Hx    Mental retardation Neg Hx    Obesity Neg Hx    Seizures Neg Hx    Epilepsy Neg Hx     ALLERGIES:  is allergic to pollen extract.  MEDICATIONS:  Current Outpatient Medications  Medication Sig Dispense Refill   cetirizine  (ZYRTEC ) 10 MG tablet Take 1 tablet by mouth daily.     citalopram (CELEXA) 10 MG tablet Take 10 mg by mouth daily.     ibuprofen  (ADVIL ) 200 MG tablet Take 200 mg by mouth every 6 (six) hours as needed.     ipratropium (ATROVENT ) 0.03 % nasal  spray Place 2 sprays into both nostrils as needed.     valACYclovir (VALTREX) 500 MG tablet Take 500 mg by mouth 2 (two) times daily.     No current facility-administered medications for this visit.    REVIEW OF SYSTEMS:   Constitutional: ( - ) fevers, ( - )  chills , ( - ) night sweats Eyes: ( - ) blurriness of vision, ( - ) double vision, ( - ) watery eyes Ears, nose, mouth, throat, and face: ( - ) mucositis, ( - ) sore throat Respiratory: ( - ) cough, ( - ) dyspnea, ( - ) wheezes Cardiovascular: ( - ) palpitation, ( - ) chest discomfort, ( - ) lower extremity swelling Gastrointestinal:  ( - ) nausea, ( - ) heartburn, ( - ) change in bowel habits Skin: ( - ) abnormal skin rashes Lymphatics: ( - ) new lymphadenopathy, ( - ) easy bruising Neurological: ( - ) numbness, ( - ) tingling, ( - ) new  weaknesses Behavioral/Psych: ( - ) mood change, ( - ) new changes  All other systems were reviewed with the patient and are negative.  PHYSICAL EXAMINATION: ECOG PERFORMANCE STATUS: {CHL ONC ECOG [phone]}  There were no vitals filed for this visit. There were no vitals filed for this visit.  GENERAL: well appearing *** in NAD  SKIN: skin color, texture, turgor are normal, no rashes or significant lesions EYES: conjunctiva are pink and non-injected, sclera clear OROPHARYNX: no exudate, no erythema; lips, buccal mucosa, and tongue normal  NECK: supple, non-tender LYMPH:  no palpable lymphadenopathy in the cervical, axillary or supraclavicular lymph nodes.  LUNGS: clear to auscultation and percussion with normal breathing effort HEART: regular rate & rhythm and no murmurs and no lower extremity edema ABDOMEN: soft, non-tender, non-distended, normal bowel sounds Musculoskeletal: no cyanosis of digits and no clubbing  PSYCH: alert & oriented x 3, fluent speech NEURO: no focal motor/sensory deficits  LABORATORY DATA:  I have reviewed the data as listed    Latest Ref Rng & Units 09/17/2017    3:26 PM 12/18/2016    9:16 PM 12/17/2016    6:19 AM  CBC  WBC 3.4 - 10.8 x10E3/uL 8.0   21.0   Hemoglobin 11.1 - 15.9 g/dL 86.9  8.3  6.5   Hematocrit 34.0 - 46.6 % 39.2  24.2  18.9   Platelets 150 - 379 x10E3/uL 361   208        Latest Ref Rng & Units 12/13/2016   12:15 PM 12/12/2016   10:37 AM  CMP  Glucose 65 - 99 mg/dL 881  90   BUN 6 - 20 mg/dL 7  6   Creatinine 9.55 - 1.00 mg/dL 9.35  9.38   Sodium 864 - 145 mmol/L 133  133   Potassium 3.5 - 5.1 mmol/L 4.6  3.6   Chloride 101 - 111 mmol/L 103  103   CO2 22 - 32 mmol/L 22  21   Calcium 8.9 - 10.3 mg/dL 8.9  8.4   Total Protein 6.5 - 8.1 g/dL 7.0  7.2   Total Bilirubin 0.3 - 1.2 mg/dL 0.5  0.5   Alkaline Phos 38 - 126 U/L 233  219   AST 15 - 41 U/L 19  17   ALT 14 - 54 U/L 9  9      PATHOLOGY: ***  BLOOD FILM: ***  Review of the peripheral blood smear showed normal appearing white cells with neutrophils that were appropriately  lobated and granulated. There was no predominance of bi-lobed or hyper-segmented neutrophils appreciated. No Dohle bodies were noted. There was no left shifting, immature forms or blasts noted. Lymphocytes remain normal in size without any predominance of large granular lymphocytes. Red cells show no anisopoikilocytosis, macrocytes , microcytes or polychromasia. There were no schistocytes, target cells, echinocytes, acanthocytes, dacrocytes, or stomatocytes.There was no rouleaux formation, nucleated red cells, or intra-cellular inclusions noted. The platelets are normal in size, shape, and color without any clumping evident.  RADIOGRAPHIC STUDIES: I have personally reviewed the radiological images as listed and agreed with the findings in the report. No results found.  ASSESSMENT & PLAN ***  No orders of the defined types were placed in this encounter.   All questions were answered. The patient knows to call the clinic with any problems, questions or concerns.  I have spent a total of {CHL ONC TIME VISIT - DTPQU:[id number]} minutes of face-to-face and non-face-to-face time, preparing to see the patient, obtaining and/or reviewing separately obtained history, performing a medically appropriate examination, counseling and educating the patient, ordering medications/tests/procedures, referring and communicating with other health care professionals, documenting clinical information in the electronic health record, independently interpreting results and communicating results to the patient, and care coordination.   Johnston Police, PA-C Department of Hematology/Oncology [facility] Phone: [phone]

## 2024-10-17 ENCOUNTER — Inpatient Hospital Stay: Attending: Physician Assistant | Admitting: Physician Assistant

## 2024-10-17 ENCOUNTER — Inpatient Hospital Stay

## 2024-10-17 VITALS — BP 120/81 | HR 75 | Temp 97.7°F | Resp 18 | Wt 232.4 lb

## 2024-10-17 DIAGNOSIS — R778 Other specified abnormalities of plasma proteins: Secondary | ICD-10-CM | POA: Insufficient documentation

## 2024-10-17 DIAGNOSIS — R771 Abnormality of globulin: Secondary | ICD-10-CM | POA: Diagnosis not present

## 2024-10-17 LAB — CBC WITH DIFFERENTIAL (CANCER CENTER ONLY)
Abs Immature Granulocytes: 0.01 K/uL (ref 0.00–0.07)
Basophils Absolute: 0 K/uL (ref 0.0–0.1)
Basophils Relative: 0 %
Eosinophils Absolute: 0.1 K/uL (ref 0.0–0.5)
Eosinophils Relative: 1 %
HCT: 37.5 % (ref 36.0–46.0)
Hemoglobin: 12.5 g/dL (ref 12.0–15.0)
Immature Granulocytes: 0 %
Lymphocytes Relative: 22 %
Lymphs Abs: 1.5 K/uL (ref 0.7–4.0)
MCH: 28.7 pg (ref 26.0–34.0)
MCHC: 33.3 g/dL (ref 30.0–36.0)
MCV: 86 fL (ref 80.0–100.0)
Monocytes Absolute: 0.5 K/uL (ref 0.1–1.0)
Monocytes Relative: 8 %
Neutro Abs: 4.8 K/uL (ref 1.7–7.7)
Neutrophils Relative %: 69 %
Platelet Count: 295 K/uL (ref 150–400)
RBC: 4.36 MIL/uL (ref 3.87–5.11)
RDW: 13.7 % (ref 11.5–15.5)
WBC Count: 6.9 K/uL (ref 4.0–10.5)
nRBC: 0 % (ref 0.0–0.2)

## 2024-10-17 LAB — CMP (CANCER CENTER ONLY)
ALT: 14 U/L (ref 0–44)
AST: 16 U/L (ref 15–41)
Albumin: 4.2 g/dL (ref 3.5–5.0)
Alkaline Phosphatase: 85 U/L (ref 38–126)
Anion gap: 8 (ref 5–15)
BUN: 11 mg/dL (ref 6–20)
CO2: 27 mmol/L (ref 22–32)
Calcium: 8.9 mg/dL (ref 8.9–10.3)
Chloride: 104 mmol/L (ref 98–111)
Creatinine: 0.74 mg/dL (ref 0.44–1.00)
GFR, Estimated: >60 mL/min
Glucose, Bld: 106 mg/dL — ABNORMAL HIGH (ref 70–99)
Potassium: 4 mmol/L (ref 3.5–5.1)
Sodium: 139 mmol/L (ref 135–145)
Total Bilirubin: 0.3 mg/dL (ref 0.0–1.2)
Total Protein: 8.1 g/dL (ref 6.5–8.1)

## 2024-10-17 LAB — SAMPLE TO BLOOD BANK

## 2024-10-17 LAB — C-REACTIVE PROTEIN: CRP: <3 mg/dL — ABNORMAL HIGH

## 2024-10-20 LAB — KAPPA/LAMBDA LIGHT CHAINS
Kappa free light chain: 28.6 mg/L — ABNORMAL HIGH (ref 3.3–19.4)
Kappa, lambda light chain ratio: 1.96 — ABNORMAL HIGH (ref 0.26–1.65)
Lambda free light chains: 14.6 mg/L (ref 5.7–26.3)

## 2024-10-21 LAB — MULTIPLE MYELOMA PANEL, SERUM
Albumin SerPl Elph-Mcnc: 3.8 g/dL (ref 2.9–4.4)
Albumin/Glob SerPl: 1 (ref 0.7–1.7)
Alpha 1: 0.2 g/dL (ref 0.0–0.4)
Alpha2 Glob SerPl Elph-Mcnc: 0.6 g/dL (ref 0.4–1.0)
B-Globulin SerPl Elph-Mcnc: 1 g/dL (ref 0.7–1.3)
Gamma Glob SerPl Elph-Mcnc: 2.1 g/dL — ABNORMAL HIGH (ref 0.4–1.8)
Globulin, Total: 4 g/dL — ABNORMAL HIGH (ref 2.2–3.9)
IgA: 280 mg/dL (ref 87–352)
IgG (Immunoglobin G), Serum: 2362 mg/dL — ABNORMAL HIGH (ref 586–1602)
IgM (Immunoglobulin M), Srm: 101 mg/dL (ref 26–217)
Total Protein ELP: 7.8 g/dL (ref 6.0–8.5)

## 2024-10-22 ENCOUNTER — Ambulatory Visit (HOSPITAL_COMMUNITY): Payer: Self-pay | Admitting: Clinical

## 2024-10-31 ENCOUNTER — Telehealth: Payer: Self-pay | Admitting: Physician Assistant

## 2024-10-31 NOTE — Telephone Encounter (Signed)
 I called Ms. Odeh to review the lab results from 10/17/2024. Findings show no cytopenias and total protein was in normal range. SPEP showed no evidence of paraproteinemia. There is evidence of polyclonal increase which is likely secondary to infectious/inflammatory process. 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 I advised her to follow up with PCP regarding her HSV infection. Dr. Federico does not recommend any further hematological workup at this time. Ms. Cervenka expressed understanding of the plan provided.

## 2024-11-05 ENCOUNTER — Ambulatory Visit (INDEPENDENT_AMBULATORY_CARE_PROVIDER_SITE_OTHER): Admitting: Adult Health

## 2024-11-05 VITALS — BP 111/74 | HR 66 | Temp 98.1°F | Ht 65.0 in | Wt 232.0 lb

## 2024-11-05 DIAGNOSIS — Z6838 Body mass index (BMI) 38.0-38.9, adult: Secondary | ICD-10-CM | POA: Diagnosis not present

## 2024-11-05 DIAGNOSIS — E559 Vitamin D deficiency, unspecified: Secondary | ICD-10-CM | POA: Diagnosis not present

## 2024-11-05 DIAGNOSIS — E669 Obesity, unspecified: Secondary | ICD-10-CM | POA: Diagnosis not present

## 2024-11-05 DIAGNOSIS — Z0000 Encounter for general adult medical examination without abnormal findings: Secondary | ICD-10-CM

## 2024-11-05 DIAGNOSIS — R739 Hyperglycemia, unspecified: Secondary | ICD-10-CM

## 2024-11-05 NOTE — Progress Notes (Signed)
 " Office: [phone]  /  Fax: [phone]   Initial Visit    Cristina [name] was seen in clinic today to evaluate for obesity. She is interested in losing weight to improve overall health and reduce the risk of weight related complications. She presents today to review program treatment options, initial physical assessment, and evaluation.     She was referred by: Specialist  When asked what else they would like to accomplish? She states: Adopt a healthier eating pattern and lifestyle, Improve energy levels and physical activity, Improve existing medical conditions, and Improve quality of life  When asked how has your weight affected you? She states: Contributed to medical problems and Having fatigue  Weight history: Child birth 2018- difficulty losing weight since then.    Highest weight: 257 lbs  Some associated conditions: Vitamin D Deficiency and Other: Elevated blood glucose  Contributing factors: consumption of processed foods, moderate to high levels of stress, and hectic pace of life  Weight promoting medications identified: None  Prior weight loss attempts: Herbal Life  Current nutrition plan: None  Current level of physical activity: Walking 30 minutes, three a week and Strength training 30 minutes, three  Current or previous pharmacotherapy: None  Response to medication: Never tried medications   Past medical history includes:   Past Medical History:  Diagnosis Date   Chlamydia infection 08/2015   IUD - Mirena removed 12/2015   Gonorrhea 08/2015   IUD Mirena removed 12/2015   HSV infection    Medical history non-contributory      Objective    BP 111/74   Pulse 66   Temp 98.1 F (36.7 C)   Ht 5' 5 (1.651 m)   Wt 232 lb (105.2 kg)   LMP 11/03/2024 (Exact Date)   SpO2 96%   BMI 38.61 kg/m  She was weighed on the bioimpedance scale: Body mass index is 38.61 kg/m.  Body Fat%:44.0, Visceral Fat Rating:10, Weight trend over the last 12 months:  Decreasing  General:  Alert, oriented and cooperative. Patient is in no acute distress.  Respiratory: Normal respiratory effort, no problems with respiration noted   Gait: able to ambulate independently  Mental Status: Normal mood and affect. Normal behavior. Normal judgment and thought content.   DIAGNOSTIC DATA REVIEWED:  BMET    Component Value Date/Time   NA 139 10/17/2024 1000   K 4.0 10/17/2024 1000   CL 104 10/17/2024 1000   CO2 27 10/17/2024 1000   GLUCOSE 106 (H) 10/17/2024 1000   BUN 11 10/17/2024 1000   CREATININE 0.74 10/17/2024 1000   CALCIUM 8.9 10/17/2024 1000   GFRNONAA >60 10/17/2024 1000   GFRAA >60 12/13/2016 1215   Lab Results  Component Value Date   HGBA1C 5.2 07/06/2016   No results found for: INSULIN CBC    Component Value Date/Time   WBC 6.9 10/17/2024 1000   WBC 21.0 (H) 12/17/2016 0619   RBC 4.36 10/17/2024 1000   HGB 12.5 10/17/2024 1000   HGB 13.0 09/17/2017 1526   HCT 37.5 10/17/2024 1000   HCT 39.2 09/17/2017 1526   PLT 295 10/17/2024 1000   PLT 361 09/17/2017 1526   MCV 86.0 10/17/2024 1000   MCV 83 09/17/2017 1526   MCH 28.7 10/17/2024 1000   MCHC 33.3 10/17/2024 1000   RDW 13.7 10/17/2024 1000   RDW 15.1 09/17/2017 1526   Iron/TIBC/Ferritin/ %Sat No results found for: IRON, TIBC, FERRITIN, IRONPCTSAT Lipid Panel  No results found for: CHOL, TRIG, HDL,  CHOLHDL, VLDL, LDLCALC, LDLDIRECT Hepatic Function Panel     Component Value Date/Time   PROT 8.1 10/17/2024 1000   ALBUMIN 4.2 10/17/2024 1000   AST 16 10/17/2024 1000   ALT 14 10/17/2024 1000   ALKPHOS 85 10/17/2024 1000   BILITOT 0.3 10/17/2024 1000      Component Value Date/Time   TSH 1.030 09/17/2017 1526     Assessment and Plan   Vitamin D deficiency  Healthcare maintenance  Blood glucose elevated  Obesity (BMI 30-39.9), STARTGING BMI 38.6    Assessment and Plan          ESTABLISH WITH [facility]    Obesity Treatment / Action  Plan:  Patient will work on garnering support from family and friends to begin weight loss journey. Will work on eliminating or reducing the presence of highly palatable, calorie dense foods in the home. Will complete provided nutritional and psychosocial assessment questionnaire before the next appointment. Will be scheduled for indirect calorimetry to determine resting energy expenditure in a fasting state.  This will allow us  to create a reduced calorie, high-protein meal plan to promote loss of fat mass while preserving muscle mass. Counseled on the health benefits of losing 5%-15% of total body weight. Was counseled on nutritional approaches to weight loss and benefits of reducing processed foods and consuming plant-based foods and high quality protein as part of nutritional weight management. Was counseled on pharmacotherapy and role as an adjunct in weight management.   Obesity Education Performed Today:  She was weighed on the bioimpedance scale and results were discussed and documented in the synopsis.  We discussed obesity as a disease and the importance of a more detailed evaluation of all the factors contributing to the disease.  We discussed the importance of long term lifestyle changes which include nutrition, exercise and behavioral modifications as well as the importance of customizing this to her specific health and social needs.  We discussed the benefits of reaching a healthier weight to alleviate the symptoms of existing conditions and reduce the risks of the biomechanical, metabolic and psychological effects of obesity.  We reviewed the four pillars of obesity medicine and importance of using a multimodal approach.  We reviewed the basic principles in weight management.   Cristina [name] appears to be in the action stage of change and states they are ready to start intensive lifestyle modifications and behavioral modifications.  I have spent 30 minutes in the care of  the patient today including: 3 minutes before the visit reviewing and preparing the chart. 20 minutes face-to-face assessing and reviewing listed medical problems as outlined in obesity care plan, providing nutritional and behavioral counseling on topics outlined in the obesity care plan, counseling regarding anti-obesity medication as outlined in obesity care plan, independently interpreting test results and goals of care, as described in assessment and plan, and reviewing and discussing biometric information and progress 3 minutes after the visit updating chart and documentation of encounter.  Reviewed by clinician on day of visit: allergies, medications, problem list, medical history, surgical history, family history, social history, and previous encounter notes pertinent to obesity diagnosis.  Laisha Rau d. Obert Espindola, NP-C   "

## 2024-12-24 ENCOUNTER — Ambulatory Visit
